# Patient Record
Sex: Female | Born: 1995 | Race: Black or African American | Hispanic: No | Marital: Single | Smoking: Never smoker
Health system: Southern US, Community
[De-identification: ages and names within clinical notes are randomized; demographics above are authoritative.]

## PROBLEM LIST (undated history)

## (undated) DIAGNOSIS — F419 Anxiety disorder, unspecified: Secondary | ICD-10-CM

## (undated) DIAGNOSIS — D649 Anemia, unspecified: Secondary | ICD-10-CM

## (undated) DIAGNOSIS — G43909 Migraine, unspecified, not intractable, without status migrainosus: Secondary | ICD-10-CM

## (undated) DIAGNOSIS — Z9889 Other specified postprocedural states: Secondary | ICD-10-CM

## (undated) HISTORY — PX: NO PAST SURGERIES: SHX2092

## (undated) HISTORY — PX: TONSILLECTOMY: SUR1361

## (undated) HISTORY — PX: DILATION AND CURETTAGE OF UTERUS: SHX78

---
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
## 2013-05-15 ENCOUNTER — Emergency Department: Payer: Self-pay | Admitting: Emergency Medicine

## 2013-05-15 LAB — URINALYSIS, COMPLETE
Ph: 6 (ref 4.5–8.0)
Protein: 100
RBC,UR: 112 /HPF (ref 0–5)

## 2013-05-15 LAB — PREGNANCY, URINE: Pregnancy Test, Urine: NEGATIVE m[IU]/mL

## 2013-06-30 ENCOUNTER — Emergency Department: Payer: Self-pay | Admitting: Internal Medicine

## 2013-08-28 ENCOUNTER — Emergency Department: Payer: Self-pay | Admitting: Emergency Medicine

## 2013-08-28 LAB — URINALYSIS, COMPLETE
Bacteria: NONE SEEN
Bilirubin,UR: NEGATIVE
Glucose,UR: NEGATIVE mg/dL (ref 0–75)
Ketone: NEGATIVE
Protein: NEGATIVE
RBC,UR: 1 /HPF (ref 0–5)
Specific Gravity: 1.025 (ref 1.003–1.030)
WBC UR: 7 /HPF (ref 0–5)

## 2013-08-28 LAB — CBC
HGB: 12.7 g/dL (ref 12.0–16.0)
MCV: 81 fL (ref 80–100)
WBC: 3.7 10*3/uL (ref 3.6–11.0)

## 2013-08-28 LAB — WET PREP, GENITAL

## 2013-08-29 LAB — URINE CULTURE

## 2014-04-25 DIAGNOSIS — Z833 Family history of diabetes mellitus: Secondary | ICD-10-CM | POA: Insufficient documentation

## 2019-02-09 DIAGNOSIS — D509 Iron deficiency anemia, unspecified: Secondary | ICD-10-CM | POA: Insufficient documentation

## 2019-02-21 ENCOUNTER — Other Ambulatory Visit: Payer: Self-pay

## 2019-02-21 ENCOUNTER — Ambulatory Visit
Admission: EM | Admit: 2019-02-21 | Discharge: 2019-02-21 | Disposition: A | Discharge status: Home / Self Care | Payer: Managed Care, Other (non HMO) | Attending: Emergency Medicine | Admitting: Emergency Medicine

## 2019-02-21 ENCOUNTER — Ambulatory Visit (INDEPENDENT_AMBULATORY_CARE_PROVIDER_SITE_OTHER): Payer: Managed Care, Other (non HMO)

## 2019-02-21 ENCOUNTER — Encounter: Payer: Self-pay | Admitting: Emergency Medicine

## 2019-02-21 DIAGNOSIS — R071 Chest pain on breathing: Secondary | ICD-10-CM

## 2019-02-21 DIAGNOSIS — J029 Acute pharyngitis, unspecified: Secondary | ICD-10-CM | POA: Diagnosis not present

## 2019-02-21 LAB — RAPID STREP SCREEN (MED CTR MEBANE ONLY): Streptococcus, Group A Screen (Direct): NEGATIVE

## 2019-02-21 MED ORDER — IBUPROFEN 600 MG PO TABS: 600.0000 mg | ORAL_TABLET | Freq: Four times a day (QID) | ORAL | 0 refills | Status: DC | PRN

## 2019-02-21 NOTE — ED Triage Notes (Signed)
Pt c/o sore throat, chest pain when she breathes and upper back pain. Started this morning. She is also having sweats and body aches.

## 2019-02-21 NOTE — ED Provider Notes (Signed)
HPI  SUBJECTIVE:  Dana Walker is a 23 y.o. female who presents with sore throat and diffuse constant anterior and posterior thoracic pain starting this morning.  States that her chest feels sore with breathing and becomes sharp with deep inspiration.  She reports a headache, questionable dyspnea on exertion.  No shortness of breath.  No fevers, body aches, coughing, wheezing, nasal congestion, rhinorrhea, postnasal drip, sinus pain or pressure, recent viral illness.  No palpitations, syncope nausea, diaphoresis with this chest pain.  It does not get worse with exertion.  No change in her physical activity.  No voice changes, sensation of throat swelling shut, difficulty breathing, drooling, trismus, abdominal pain, rash, allergy or GERD symptoms.  She got a flu shot this year.  No contacts with the flu.  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  No antipyretic in the past 4 to 6 hours.  She tried peppermint tea with improvement in her sore throat, her chest pain is worse with deep inspiration, torso rotation, arm movement, and sitting straight up.  Past medical history of anemia, GERD during pregnancy.  No history of asthma, emphysema, COPD, smoking, vaping, pneumothorax, diabetes, hypertension, PE, DVT, cancer.  P: 3/9.  Denies possibility being pregnant.  PMD: Dr. Serita Kyle at [facility] family practice in [place].    History reviewed. No pertinent past medical history.  Past Surgical History:  Procedure Laterality Date  . NO PAST SURGERIES      Family History  Problem Relation Age of Onset  . Diabetes Mother   . Healthy Father     Social History   Tobacco Use  . Smoking status: Never Smoker  . Smokeless tobacco: Never Used  Substance Use Topics  . Alcohol use: Not Currently  . Drug use: Not Currently    No current facility-administered medications for this encounter.   Current Outpatient Medications:   .  ibuprofen (ADVIL,MOTRIN) 600 MG tablet, Take 1 tablet (600 mg total) by mouth every 6 (six) hours as needed., Disp: 30 tablet, Rfl: 0  Allergies  Allergen Reactions  . Penicillins Diarrhea  . Latex Rash     ROS  As noted in HPI.   Physical Exam  BP 109/74 (BP Location: Left Arm)   Pulse 74   Temp 98.1 F (36.7 C) (Oral)   Resp 18   Ht 5\' 6"  (1.676 m)   Wt 58.1 kg   LMP 02/14/2019 (Approximate)   SpO2 100%   BMI 20.66 kg/m   Constitutional: Well developed, well nourished, no acute distress Eyes:  EOMI, conjunctiva normal bilaterally HENT: Normocephalic, atraumatic,mucus membranes moist.  No nasal congestion.  Normal turbinates.  Tonsils surgically absent.  Oropharynx normal.  No postnasal drip, cobblestoning. Neck: No appreciable anterior, posterior cervical lymphadenopathy Respiratory: Normal inspiratory effort, lungs clear bilaterally, good air movement.  Positive lateral chest wall tenderness.  Pain aggravated with arm movement, torso rotation. Cardiovascular: Normal rate regular rhythm, no murmurs rubs or gallops GI: nondistended, no splenomegaly. skin: No rash, skin intact Musculoskeletal: no deformities.  Calves symmetric, nontender, no edema. Neurologic: Alert & oriented x 3, no focal neuro deficits Psychiatric: Speech and behavior appropriate   ED Course   Medications - No data to display  Orders Placed This Encounter  Procedures  . Rapid Strep Screen ([facility] ONLY)    Standing Status:   Standing    Number of Occurrences:   1  .  Culture, group A strep    Standing Status:   Standing    Number of Occurrences:   1  . DG Chest 2 View    Standing Status:   Standing    Number of Occurrences:   1    Order Specific Question:   Reason for Exam (SYMPTOM  OR DIAGNOSIS REQUIRED)    Answer:   Chest pain  . Droplet precaution    Standing Status:   Standing    Number of Occurrences:   1    Results for orders placed or performed during the hospital  encounter of 02/21/19 (from the past 24 hour(s))  Rapid Strep Screen ([facility] ONLY)     Status: None   Collection Time: 02/21/19  7:17 PM  Result Value Ref Range   Streptococcus, Group A Screen (Direct) NEGATIVE NEGATIVE   Dg Chest 2 View  Result Date: 02/21/2019 CLINICAL DATA:  Chest pain EXAM: CHEST - 2 VIEW COMPARISON:  None. FINDINGS: Heart and mediastinal contours are within normal limits. No focal opacities or effusions. No acute bony abnormality. IMPRESSION: No active cardiopulmonary disease. Electronically Signed   By: Charlett Nose M.D.   On: 02/21/2019 20:01    ED Clinical Impression  Sore throat  Chest pain on breathing   ED Assessment/Plan  Checking chest x-ray to rule out pneumothorax, dissection, pulmonary edema, pneumonia.  Her presentation seems very consistent with musculoskeletal chest pain.  GERD in the ddx with the sore throat.  Strep negative.  She refused a flu test but I do not think that this is influenza.  Chest pain is reproducible with movement and palpation.   PERC negative. Doubt PE.  Feel that she is very low risk for ACS.  Doubt pericarditis, myocarditis.  Reviewed imaging independently.  Normal chest x-ray.  See radiology report for full details.  Chest x-ray normal.  Presentation consistent with a musculoskeletal chest pain versus pleurisy.  We will send home with supportive treatment including ibuprofen 600 mg combined with 1 g of Tylenol 3-4 times a day as needed.  Benadryl/Maalox mixture and throat coat tea for the sore throat.  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  Discussed imaging, MDM, treatment plan, and plan for follow-up with patient. Discussed sn/sx that should prompt return to the ED. patient agrees with plan.   Meds ordered this encounter  Medications  . ibuprofen (ADVIL,MOTRIN) 600 MG tablet    Sig: Take 1 tablet (600 mg total) by mouth every 6 (six) hours as needed.    Dispense:  30 tablet    Refill:  0     *This clinic note was created using Scientist, clinical (histocompatibility and immunogenetics). Therefore, there may be occasional mistakes despite careful proofreading.   ?   Domenick Gong, MD 02/21/19 2035

## 2019-02-21 NOTE — Discharge Instructions (Signed)
your rapid strep was negative today, so we have sent off a throat culture.  We will contact you and call in the appropriate antibiotics if your culture comes back positive for an infection requiring antibiotic treatment.  Give us a working phone number. 1 gram of Tylenol and 600 mg ibuprofen together 3-4 times a day as needed for pain.  Make sure you drink plenty of extra fluids.  Some people find salt water gargles and  Traditional Medic[employer]'s "Throat Coat" tea helpful. Take 5 mL of liquid Benadryl and 5 mL of Maalox. Mix it together, and then hold it in your mouth for as long as you can and then swallow. You may do this 4 times a day.   ° °Go to www.goodrx.com to look up your medications. This will give you a list of where you can find your prescriptions at the most affordable prices. Or ask the pharmacist what the cash price is, or if they have any other discount programs available to help make your medication more affordable. This can be less expensive than what you would pay with insurance.   °

## 2019-02-24 LAB — CULTURE, GROUP A STREP (THRC)

## 2019-09-30 ENCOUNTER — Ambulatory Visit
Admission: EM | Admit: 2019-09-30 | Discharge: 2019-09-30 | Disposition: A | Discharge status: Home / Self Care | Payer: Managed Care, Other (non HMO) | Attending: Family Medicine | Admitting: Family Medicine

## 2019-09-30 ENCOUNTER — Ambulatory Visit (INDEPENDENT_AMBULATORY_CARE_PROVIDER_SITE_OTHER): Payer: Managed Care, Other (non HMO)

## 2019-09-30 ENCOUNTER — Other Ambulatory Visit: Payer: Self-pay

## 2019-09-30 ENCOUNTER — Encounter: Payer: Self-pay | Admitting: Emergency Medicine

## 2019-09-30 DIAGNOSIS — M25571 Pain in right ankle and joints of right foot: Secondary | ICD-10-CM

## 2019-09-30 MED ORDER — HYDROCODONE-ACETAMINOPHEN 5-325 MG PO TABS: ORAL_TABLET | ORAL | 0 refills | Status: DC

## 2019-09-30 MED ORDER — PREDNISONE 20 MG PO TABS: ORAL_TABLET | ORAL | 0 refills | Status: DC

## 2019-09-30 NOTE — ED Provider Notes (Signed)
[facility]    CSN: [id number] Arrival date & time: 09/30/19  1749      History   Chief Complaint Chief Complaint  Patient presents with  . Ankle Pain    HPI Dana Walker is a 23 y.o. female.   23 yo female with a c/o right ankle pain for the past 3 weeks but significant increase in pain for the past 5 days. Denies any injury, fall, fevers, chills, rash.    Ankle Pain   History reviewed. No pertinent past medical history.  There are no active problems to display for this patient.   Past Surgical History:  Procedure Laterality Date  . NO PAST SURGERIES      OB History   No obstetric history on file.      Home Medications    Prior to Admission medications   Medication Sig Start Date End Date Taking? Authorizing Provider  HYDROcodone-acetaminophen (NORCO/VICODIN) 5-325 MG tablet 1-2 tabs po bid prn 09/30/19   Norval Gable, MD  ibuprofen (ADVIL,MOTRIN) 600 MG tablet Take 1 tablet (600 mg total) by mouth every 6 (six) hours as needed. 02/21/19   Melynda Ripple, MD  predniSONE (DELTASONE) 20 MG tablet 3 tabs po qd x 2 days, then 2 tabs po qd x 2 days, then 1 tab po qd x 2 days, then half a tab po qd x 2 days 09/30/19   Norval Gable, MD    Family History Family History  Problem Relation Age of Onset  . Diabetes Mother   . Healthy Father     Social History Social History   Tobacco Use  . Smoking status: Never Smoker  . Smokeless tobacco: Never Used  Substance Use Topics  . Alcohol use: Not Currently  . Drug use: Not Currently     Allergies   Penicillins and Latex   Review of Systems Review of Systems   Physical Exam Triage Vital Signs ED Triage Vitals [09/30/19 1818]  Enc Vitals Group     BP 112/76     Pulse Rate 74     Resp 14     Temp 98.3 F (36.8 C)     Temp Source Oral     SpO2 100 %     Weight 136 lb (61.7 kg)     Height 5\' 6"  (1.676 m)     Head Circumference      Peak Flow      Pain Score 7     Pain Loc       Pain Edu?      Excl. in [place]?    No data found.  Updated Vital Signs BP 112/76 (BP Location: Left Arm)   Pulse 74   Temp 98.3 F (36.8 C) (Oral)   Resp 14   Ht 5\' 6"  (1.676 m)   Wt 61.7 kg   LMP 09/08/2019 (Approximate)   SpO2 100%   BMI 21.95 kg/m   Visual Acuity Right Eye Distance:   Left Eye Distance:   Bilateral Distance:    Right Eye Near:   Left Eye Near:    Bilateral Near:     Physical Exam Vitals signs and nursing note reviewed.  Constitutional:      General: She is not in acute distress.    Appearance: She is not toxic-appearing or diaphoretic.  Musculoskeletal:     Right ankle: She exhibits swelling. She exhibits normal range of motion, no ecchymosis, no deformity, no laceration and normal pulse. Tenderness (diffuse). Lateral malleolus  and medial malleolus tenderness found. No AITFL, no CF ligament, no posterior TFL, no head of 5th metatarsal and no proximal fibula tenderness found. Achilles tendon normal.  Neurological:     Mental Status: She is alert.      UC Treatments / Results  Labs (all labs ordered are listed, but only abnormal results are displayed) Labs Reviewed - No data to display  EKG   Radiology Dg Ankle Complete Right  Result Date: 09/30/2019 CLINICAL DATA:  Pain and swelling for 3 weeks EXAM: RIGHT ANKLE - COMPLETE 3+ VIEW COMPARISON:  None. FINDINGS: Frontal, oblique, and lateral views were obtained. No evident fracture or joint effusion. Joint spaces appear normal. No erosive change. Ankle mortise appears intact. IMPRESSION: No evident fracture or arthropathy.  Ankle mortise appears intact. Electronically Signed   By: Bretta Bang III M.D.   On: 09/30/2019 18:41    Procedures Procedures (including critical care time)  Medications Ordered in UC Medications - No data to display  Initial Impression / Assessment and Plan / UC Course  I have reviewed the triage vital signs and the nursing notes.  Pertinent labs & imaging  results that were available during my care of the patient were reviewed by me and considered in my medical decision making (see chart for details).     Final Clinical Impressions(s) / UC Diagnoses   Final diagnoses:  Acute right ankle pain  (likely gout)   Discharge Instructions     Rest, ice, elevation    ED Prescriptions    Medication Sig Dispense Auth. Provider   predniSONE (DELTASONE) 20 MG tablet 3 tabs po qd x 2 days, then 2 tabs po qd x 2 days, then 1 tab po qd x 2 days, then half a tab po qd x 2 days 13 tablet Matraca Hunkins, Pamala Hurry, MD   HYDROcodone-acetaminophen (NORCO/VICODIN) 5-325 MG tablet 1-2 tabs po bid prn 6 tablet Payton Mccallum, MD      1. x-ray result (negative) and diagnosis reviewed with patient 2. rx as per orders above; reviewed possible side effects, interactions, risks and benefits  3. Recommend supportive treatment as above 4. Follow-up prn if symptoms worsen or don't improve   I have reviewed the PDMP during this encounter.   Payton Mccallum, MD 09/30/19 2022

## 2019-09-30 NOTE — Discharge Instructions (Signed)
Rest, ice, elevation °

## 2019-09-30 NOTE — ED Triage Notes (Signed)
Patient c/o right ankle pain for the past 3 weeks.  Patient denies any specific injury.

## 2019-11-14 ENCOUNTER — Ambulatory Visit
Admission: EM | Admit: 2019-11-14 | Discharge: 2019-11-14 | Disposition: A | Discharge status: Home / Self Care | Payer: Managed Care, Other (non HMO) | Attending: Family Medicine | Admitting: Family Medicine

## 2019-11-14 ENCOUNTER — Other Ambulatory Visit: Payer: Self-pay

## 2019-11-14 DIAGNOSIS — B349 Viral infection, unspecified: Secondary | ICD-10-CM | POA: Diagnosis not present

## 2019-11-14 DIAGNOSIS — R110 Nausea: Secondary | ICD-10-CM

## 2019-11-14 MED ORDER — ONDANSETRON 8 MG PO TBDP: 8.0000 mg | ORAL_TABLET | Freq: Three times a day (TID) | ORAL | 0 refills | Status: DC | PRN

## 2019-11-14 NOTE — ED Provider Notes (Signed)
[facility]    CSN: [id number] Arrival date & time: 11/14/19  1705      History   Chief Complaint Chief Complaint  Patient presents with  . Diarrhea  . Nausea    HPI Dana Walker is a 23 y.o. female.   23 yo female with a c/o nausea, diarrhea, shortness of breath since yesterday and loss of taste today. Patient denies any fevers, chills. States mom just tested positive for covid.     Diarrhea   History reviewed. No pertinent past medical history.  There are no active problems to display for this patient.   Past Surgical History:  Procedure Laterality Date  . NO PAST SURGERIES      OB History   No obstetric history on file.      Home Medications    Prior to Admission medications   Medication Sig Start Date End Date Taking? Authorizing Provider  HYDROcodone-acetaminophen (NORCO/VICODIN) 5-325 MG tablet 1-2 tabs po bid prn 09/30/19   Norval Gable, MD  ibuprofen (ADVIL,MOTRIN) 600 MG tablet Take 1 tablet (600 mg total) by mouth every 6 (six) hours as needed. 02/21/19   Melynda Ripple, MD  ondansetron (ZOFRAN ODT) 8 MG disintegrating tablet Take 1 tablet (8 mg total) by mouth every 8 (eight) hours as needed. 11/14/19   Norval Gable, MD  predniSONE (DELTASONE) 20 MG tablet 3 tabs po qd x 2 days, then 2 tabs po qd x 2 days, then 1 tab po qd x 2 days, then half a tab po qd x 2 days 09/30/19   Norval Gable, MD    Family History Family History  Problem Relation Age of Onset  . Diabetes Mother   . Healthy Father     Social History Social History   Tobacco Use  . Smoking status: Never Smoker  . Smokeless tobacco: Never Used  Substance Use Topics  . Alcohol use: Not Currently  . Drug use: Not Currently     Allergies   Ceclor [cefaclor], Penicillins, and Latex   Review of Systems Review of Systems  Gastrointestinal: Positive for diarrhea.     Physical Exam Triage Vital Signs ED Triage Vitals  Enc Vitals Group     BP 11/14/19  1724 114/70     Pulse Rate 11/14/19 1724 83     Resp 11/14/19 1724 18     Temp 11/14/19 1724 98.8 F (37.1 C)     Temp Source 11/14/19 1724 Oral     SpO2 11/14/19 1724 100 %     Weight 11/14/19 1725 135 lb 12.9 oz (61.6 kg)     Height 11/14/19 1725 5\' 1"  (1.549 m)     Head Circumference --      Peak Flow --      Pain Score 11/14/19 1725 0     Pain Loc --      Pain Edu? --      Excl. in [place]? --    No data found.  Updated Vital Signs BP 114/70 (BP Location: Right Arm)   Pulse 83   Temp 98.8 F (37.1 C) (Oral)   Resp 18   Ht 5\' 1"  (1.549 m)   Wt 61.6 kg   LMP 10/08/2019   SpO2 100%   BMI 25.66 kg/m   Visual Acuity Right Eye Distance:   Left Eye Distance:   Bilateral Distance:    Right Eye Near:   Left Eye Near:    Bilateral Near:     Physical Exam  Vitals signs and nursing note reviewed.  Constitutional:      General: She is not in acute distress.    Appearance: She is not toxic-appearing or diaphoretic.  Cardiovascular:     Rate and Rhythm: Normal rate.  Pulmonary:     Effort: Pulmonary effort is normal. No respiratory distress.     Breath sounds: Normal breath sounds.  Abdominal:     General: Bowel sounds are normal. There is no distension.     Palpations: Abdomen is soft. There is no mass.     Tenderness: There is no abdominal tenderness. There is no right CVA tenderness, left CVA tenderness, guarding or rebound.     Hernia: No hernia is present.  Neurological:     Mental Status: She is alert.      UC Treatments / Results  Labs (all labs ordered are listed, but only abnormal results are displayed) Labs Reviewed  NOVEL CORONAVIRUS, NAA (HOSP ORDER, SEND-OUT TO REF LAB; TAT 18-24 HRS)    EKG   Radiology No results found.  Procedures Procedures (including critical care time)  Medications Ordered in UC Medications - No data to display  Initial Impression / Assessment and Plan / UC Course  I have reviewed the triage vital signs and the nursing  notes.  Pertinent labs & imaging results that were available during my care of the patient were reviewed by me and considered in my medical decision making (see chart for details).      Final Clinical Impressions(s) / UC Diagnoses   Final diagnoses:  Viral syndrome  Nausea     Discharge Instructions     Over the counter Imodium AD, tylenol as needed Increase fluids     ED Prescriptions    Medication Sig Dispense Auth. Provider   ondansetron (ZOFRAN ODT) 8 MG disintegrating tablet Take 1 tablet (8 mg total) by mouth every 8 (eight) hours as needed. 6 tablet Payton Mccallum, MD     1. diagnosis reviewed with patient 2. rx as per orders above; reviewed possible side effects, interactions, risks and benefits  3. Recommend supportive treatment as above 4. covid test done  5. Follow-up prn if symptoms worsen or don't improve   PDMP not reviewed this encounter.   Payton Mccallum, MD 11/14/19 1754

## 2019-11-14 NOTE — Discharge Instructions (Signed)
Over the counter Imodium AD, tylenol as needed Increase fluids

## 2019-11-14 NOTE — ED Triage Notes (Addendum)
Pt states she started yesterday with feeling of SOB, nausea, diarrhea, and an episode of vomiting today. Fever better today. Loss of taste today. Mom just tested positive for COVID

## 2019-11-15 LAB — NOVEL CORONAVIRUS, NAA (HOSP ORDER, SEND-OUT TO REF LAB; TAT 18-24 HRS): SARS-CoV-2, NAA: NOT DETECTED

## 2019-11-27 ENCOUNTER — Other Ambulatory Visit: Payer: Self-pay

## 2019-11-27 ENCOUNTER — Ambulatory Visit
Admission: EM | Admit: 2019-11-27 | Discharge: 2019-11-27 | Disposition: A | Discharge status: Home / Self Care | Payer: Managed Care, Other (non HMO) | Attending: Family Medicine | Admitting: Family Medicine

## 2019-11-27 DIAGNOSIS — J029 Acute pharyngitis, unspecified: Secondary | ICD-10-CM | POA: Diagnosis not present

## 2019-11-27 DIAGNOSIS — R05 Cough: Secondary | ICD-10-CM

## 2019-11-27 DIAGNOSIS — R509 Fever, unspecified: Secondary | ICD-10-CM | POA: Diagnosis not present

## 2019-11-27 DIAGNOSIS — J069 Acute upper respiratory infection, unspecified: Secondary | ICD-10-CM

## 2019-11-27 LAB — RAPID STREP SCREEN (MED CTR MEBANE ONLY): Streptococcus, Group A Screen (Direct): NEGATIVE

## 2019-11-27 NOTE — ED Provider Notes (Signed)
[facility]    CSN: [id number] Arrival date & time: 11/27/19  1027      History   Chief Complaint Chief Complaint  Patient presents with  . Sore Throat    HPI Dana Walker is a 23 y.o. female.   23 yo female with a c/o fever, cough, headaches, nausea for the past 6 days. Denies any chest pain, shortness of breath. States positive covid positive exposure.    Sore Throat    No past medical history on file.  There are no problems to display for this patient.   Past Surgical History:  Procedure Laterality Date  . NO PAST SURGERIES      OB History   No obstetric history on file.      Home Medications    Prior to Admission medications   Medication Sig Start Date End Date Taking? Authorizing Provider  HYDROcodone-acetaminophen (NORCO/VICODIN) 5-325 MG tablet 1-2 tabs po bid prn 09/30/19   Norval Gable, MD  ibuprofen (ADVIL,MOTRIN) 600 MG tablet Take 1 tablet (600 mg total) by mouth every 6 (six) hours as needed. 02/21/19   Melynda Ripple, MD  ondansetron (ZOFRAN ODT) 8 MG disintegrating tablet Take 1 tablet (8 mg total) by mouth every 8 (eight) hours as needed. 11/14/19   Norval Gable, MD  predniSONE (DELTASONE) 20 MG tablet 3 tabs po qd x 2 days, then 2 tabs po qd x 2 days, then 1 tab po qd x 2 days, then half a tab po qd x 2 days 09/30/19   Norval Gable, MD    Family History Family History  Problem Relation Age of Onset  . Diabetes Mother   . Healthy Father     Social History Social History   Tobacco Use  . Smoking status: Never Smoker  . Smokeless tobacco: Never Used  Substance Use Topics  . Alcohol use: Not Currently  . Drug use: Not Currently     Allergies   Ceclor [cefaclor], Penicillins, and Latex   Review of Systems Review of Systems   Physical Exam Triage Vital Signs ED Triage Vitals  Enc Vitals Group     BP 11/27/19 1040 107/68     Pulse Rate 11/27/19 1040 87     Resp 11/27/19 1040 16     Temp 11/27/19  1040 98.3 F (36.8 C)     Temp Source 11/27/19 1040 Oral     SpO2 11/27/19 1040 100 %     Weight 11/27/19 1038 136 lb (61.7 kg)     Height 11/27/19 1038 5\' 6"  (1.676 m)     Head Circumference --      Peak Flow --      Pain Score 11/27/19 1038 6     Pain Loc --      Pain Edu? --      Excl. in [employer]? --    No data found.  Updated Vital Signs BP 107/68 (BP Location: Left Arm)   Pulse 87   Temp 98.3 F (36.8 C) (Oral)   Resp 16   Ht 5\' 6"  (1.676 m)   Wt 61.7 kg   SpO2 100%   BMI 21.95 kg/m   Visual Acuity Right Eye Distance:   Left Eye Distance:   Bilateral Distance:    Right Eye Near:   Left Eye Near:    Bilateral Near:     Physical Exam Vitals and nursing note reviewed.  Constitutional:      General: She is not in acute distress.  Appearance: She is not toxic-appearing or diaphoretic.  Cardiovascular:     Rate and Rhythm: Normal rate.  Pulmonary:     Effort: Pulmonary effort is normal. No respiratory distress.  Neurological:     Mental Status: She is alert.      UC Treatments / Results  Labs (all labs ordered are listed, but only abnormal results are displayed) Labs Reviewed  RAPID STREP SCREEN ([facility] ONLY)  CULTURE, GROUP A STREP ([facility])  NOVEL CORONAVIRUS, NAA (HOSP ORDER, SEND-OUT TO REF LAB; TAT 18-24 HRS)    EKG   Radiology No results found.  Procedures Procedures (including critical care time)  Medications Ordered in UC Medications - No data to display  Initial Impression / Assessment and Plan / UC Course  I have reviewed the triage vital signs and the nursing notes.  Pertinent labs & imaging results that were available during my care of the patient were reviewed by me and considered in my medical decision making (see chart for details).      Final Clinical Impressions(s) / UC Diagnoses   Final diagnoses:  Viral URI with cough     Discharge Instructions     Rest, fluids, over the counter medications as  needed    ED Prescriptions    None      1. Lab result and diagnosis reviewed with patient 2. covid test done  3. Recommend supportive treatment as above 4. Follow-up prn if symptoms worsen or don't improve  PDMP not reviewed this encounter.   Payton Mccallum, MD 11/27/19 1126

## 2019-11-27 NOTE — ED Triage Notes (Signed)
Onset 6 days fever, HA, nausea was tested for Covid negative in early December.

## 2019-11-27 NOTE — Discharge Instructions (Signed)
Rest, fluids, over the counter medications as needed  

## 2019-11-28 LAB — NOVEL CORONAVIRUS, NAA (HOSP ORDER, SEND-OUT TO REF LAB; TAT 18-24 HRS): SARS-CoV-2, NAA: NOT DETECTED

## 2019-11-30 LAB — CULTURE, GROUP A STREP (THRC)

## 2021-01-11 IMAGING — CR DG ANKLE COMPLETE 3+V*R*
3 series · 3 of 3 positions shown · non-contrast
Comparison: None.

CLINICAL DATA: Pain and swelling for 3 weeks

EXAM:
RIGHT ANKLE - COMPLETE 3+ VIEW

[ankle ap]
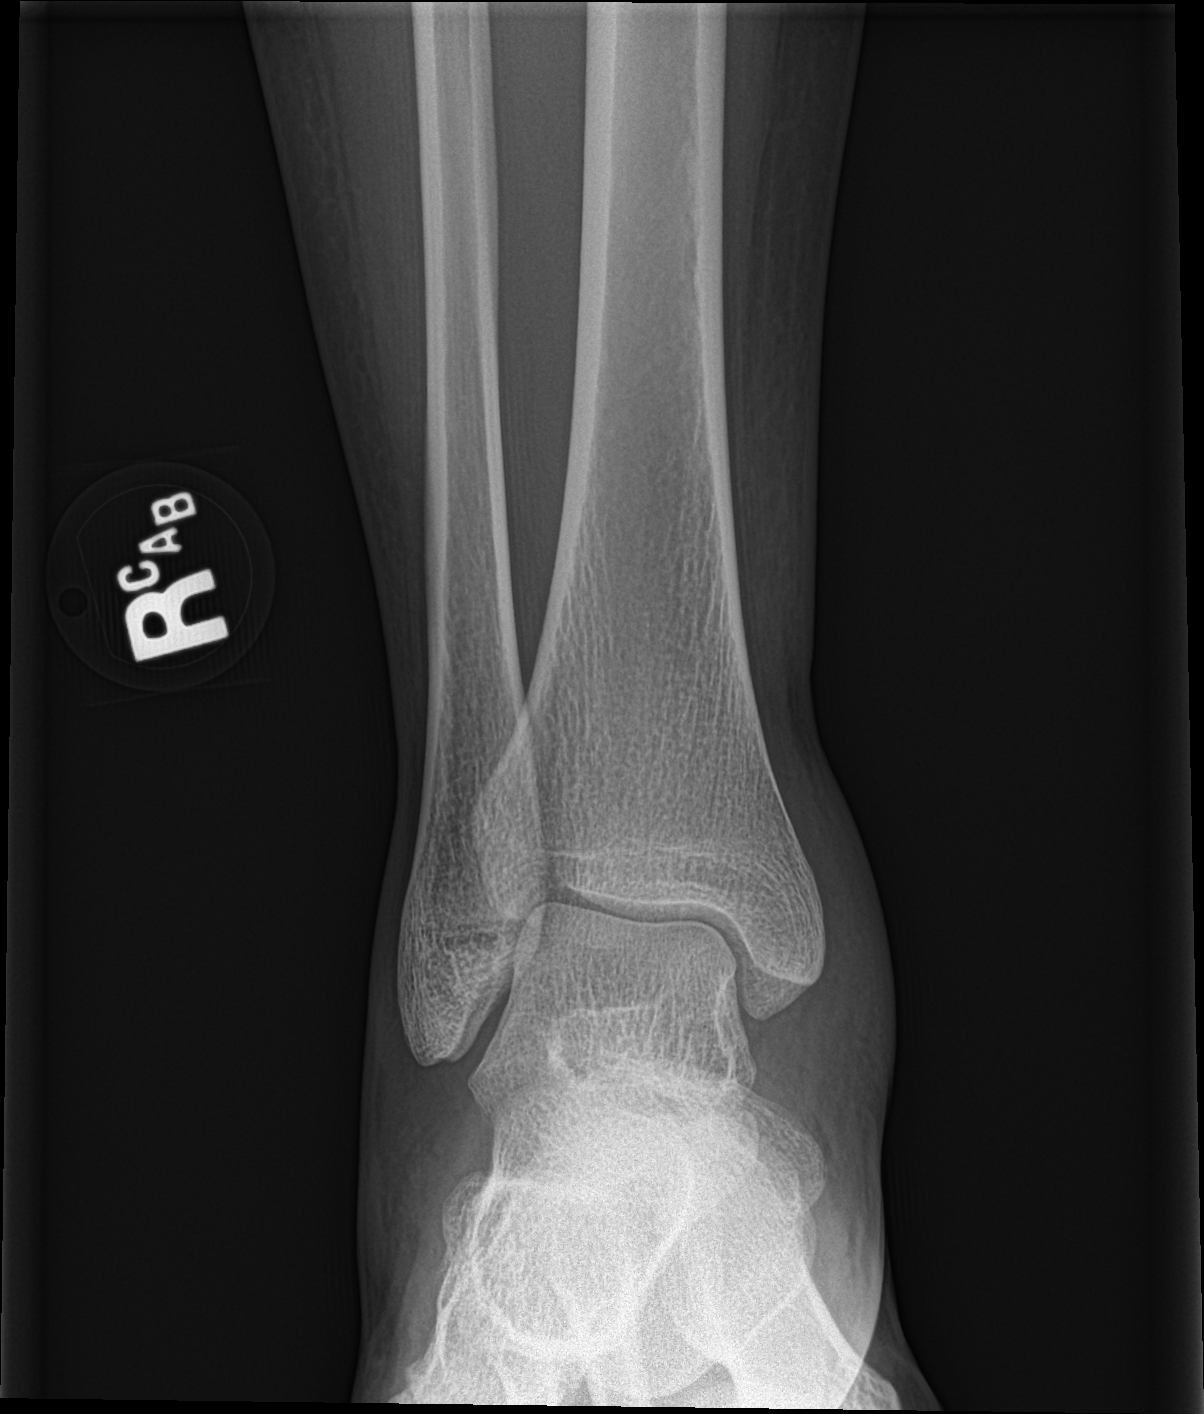

[ankle obl]
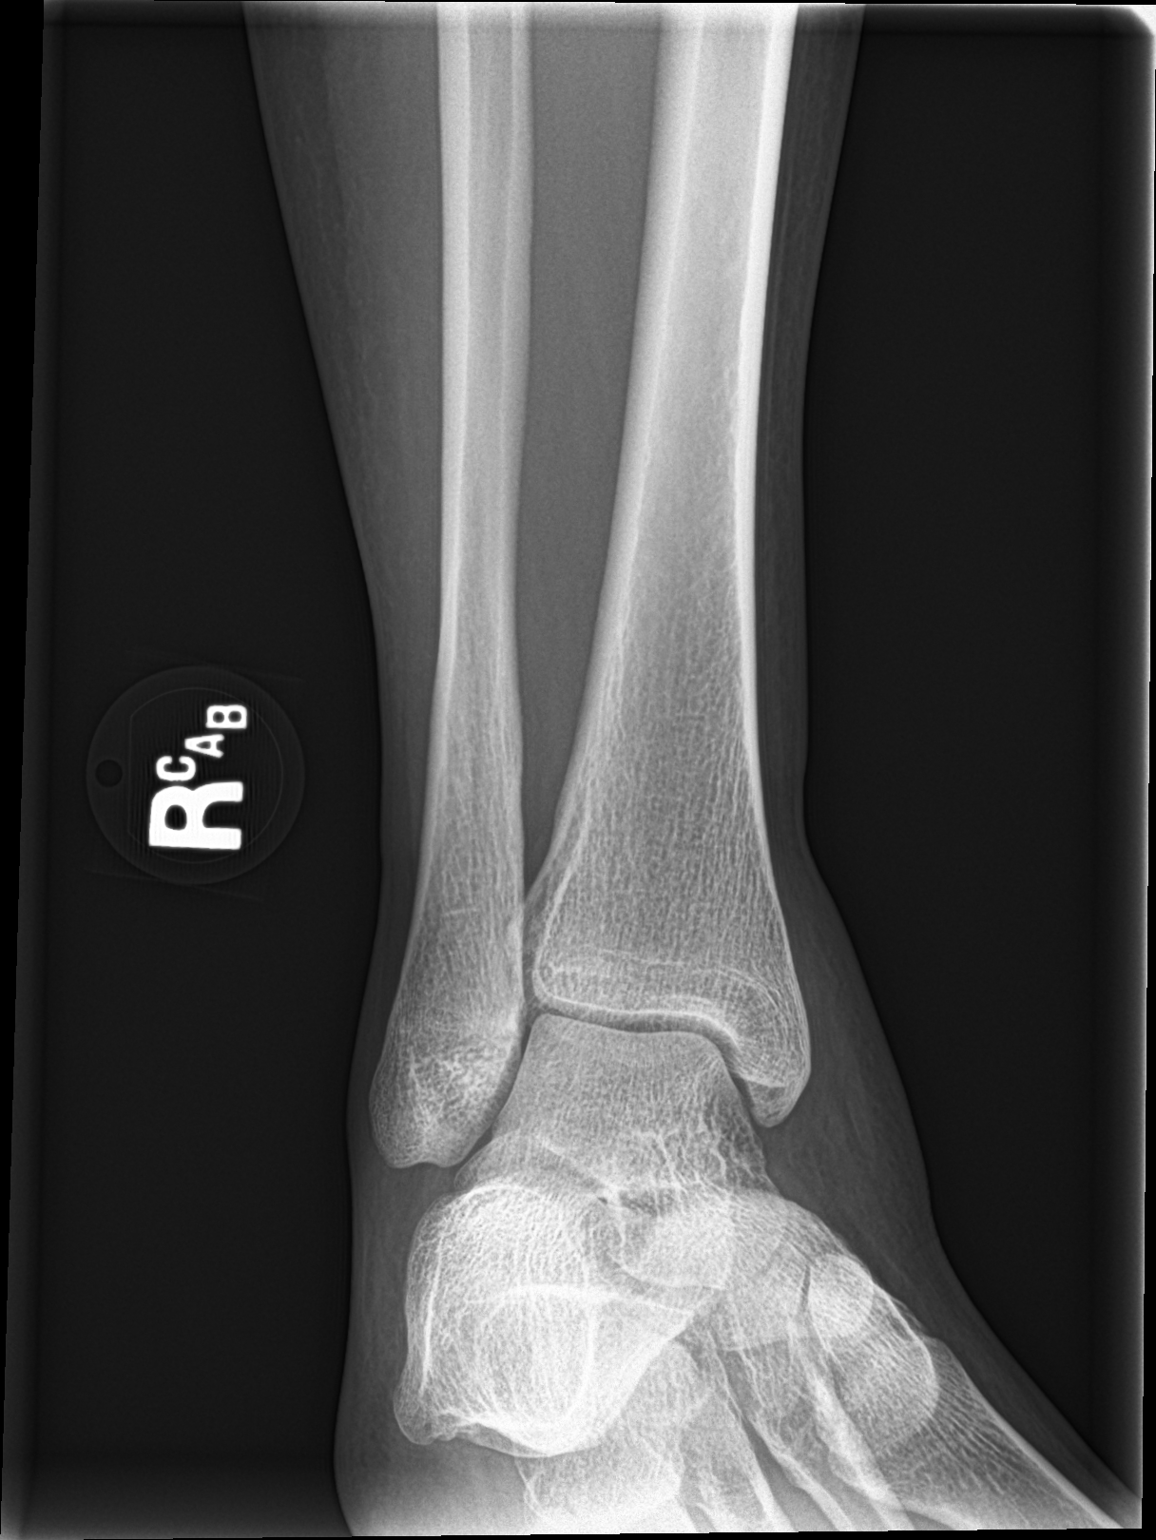

[ankle lat]
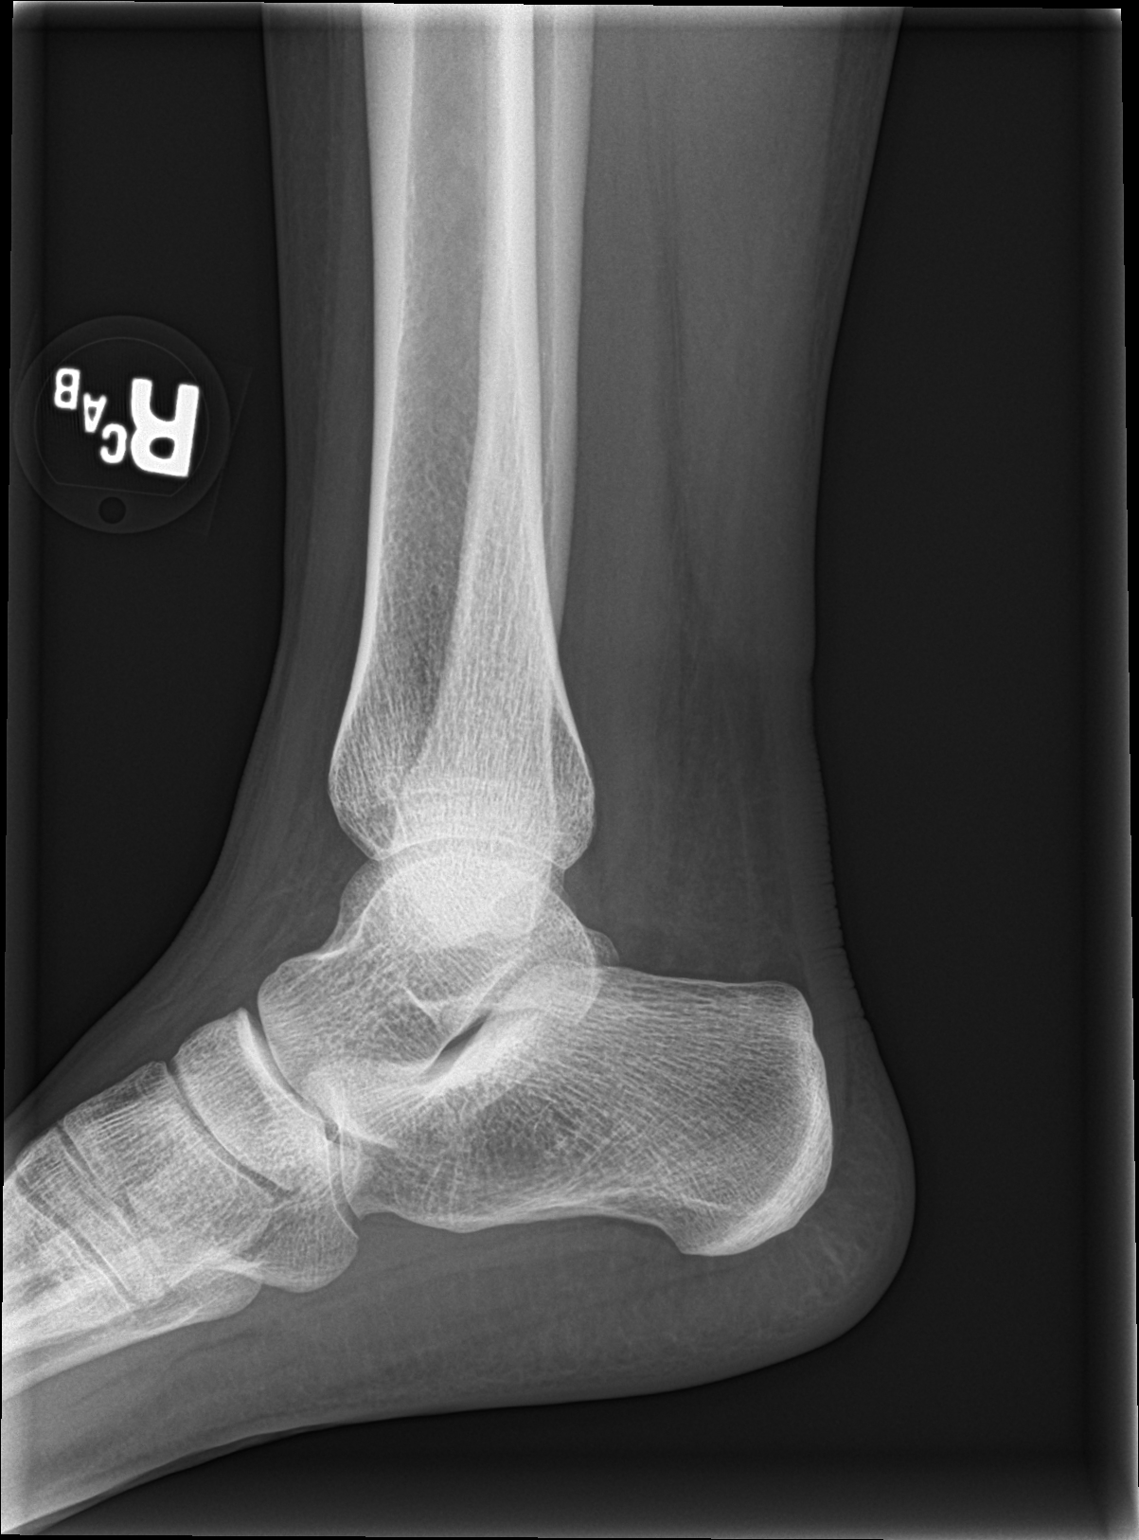

[3 of 3 positions shown; findings below may reference images not displayed]

FINDINGS: Frontal, oblique, and lateral views were obtained. No evident
fracture or joint effusion. Joint spaces appear normal. No erosive
change. Ankle mortise appears intact.
IMPRESSION: No evident fracture or arthropathy.  Ankle mortise appears intact.

## 2021-01-19 ENCOUNTER — Ambulatory Visit
Admission: EM | Admit: 2021-01-19 | Discharge: 2021-01-19 | Disposition: A | Discharge status: Home / Self Care | Payer: 59 | Attending: Emergency Medicine | Admitting: Emergency Medicine

## 2021-01-19 ENCOUNTER — Other Ambulatory Visit: Payer: Self-pay

## 2021-01-19 DIAGNOSIS — J029 Acute pharyngitis, unspecified: Secondary | ICD-10-CM | POA: Diagnosis not present

## 2021-01-19 MED ORDER — LEVOCETIRIZINE DIHYDROCHLORIDE 5 MG PO TABS: 5.0000 mg | ORAL_TABLET | Freq: Every evening | ORAL | 1 refills | Status: DC

## 2021-01-19 MED ORDER — FLUTICASONE PROPIONATE 50 MCG/ACT NA SUSP: 2.0000 | Freq: Every day | NASAL | 2 refills | Status: DC

## 2021-01-19 NOTE — ED Provider Notes (Signed)
[facility] [place] [facility] [place]   Name: Dana Walker DOB: [birth year] MRN: [id number] CSN: [id number] PCP: Cathie Hoops, PA  Arrival date and time:  01/19/21 0847  Chief Complaint:  Sore Throat and Cough   NOTE: Prior to seeing the patient today, I have reviewed the triage nursing documentation and vital signs. Clinical staff has updated patient's PMH/PSHx, current medication list, and drug allergies/intolerances to ensure comprehensive history available to assist in medical decision making.   History:   HPI: Dana Walker is a 25 y.o. female who presents today with complaints of sore throat and migraine.  Surface at approximately Wednesday and she has also noticed some increased drainage to the back of her throat.  The drainage gets to the point that she feels as if she is choking or gagging.  She has tried warm salt water gargles and has felt minimal to no relief.  She has not tried any over-the-counter medications such as antihistamines, or Tylenol/ibuprofen for her headache.  She denies shortness of breath, coughing, fevers, body aches.  She is unvaccinated for Covid and states she is not interested in getting a vaccine at this time.  She works from home.   History reviewed. No pertinent past medical history.  Past Surgical History:  Procedure Laterality Date  . TONSILLECTOMY      Family History  Problem Relation Age of Onset  . Diabetes Mother   . Healthy Father     Social History   Tobacco Use  . Smoking status: Never Smoker  . Smokeless tobacco: Never Used  Vaping Use  . Vaping Use: Never used  Substance Use Topics  . Alcohol use: Not Currently  . Drug use: Not Currently    There are no problems to display for this patient.   Home Medications:    Current Meds  Medication Sig  . fluticasone (FLONASE) 50 MCG/ACT nasal spray Place 2 sprays into both nostrils daily.  Marland Kitchen levocetirizine (XYZAL) 5 MG tablet Take 1 tablet (5 mg total) by mouth  every evening.    Allergies:   Ceclor [cefaclor], Penicillins, and Latex  Review of Systems (ROS): Review of Systems  Constitutional: Negative for activity change, appetite change, chills, fatigue and fever.  HENT: Positive for postnasal drip and sore throat. Negative for congestion, sneezing and tinnitus.   Respiratory: Negative for cough and wheezing.   Gastrointestinal: Negative for nausea.  Musculoskeletal: Negative for myalgias.  Neurological: Positive for headaches. Negative for dizziness and weakness.     Vital Signs: Today's Vitals   01/19/21 0903 01/19/21 0904 01/19/21 0906 01/19/21 0940  BP:   107/77   Pulse:   82   Resp:   18   Temp:   98.4 F (36.9 C)   TempSrc:   Oral   SpO2:   99%   Weight:  138 lb (62.6 kg)    Height:  5\' 6"  (1.676 m)    PainSc: 7    7     Physical Exam: Physical Exam Vitals and nursing note reviewed.  Constitutional:      General: She is not in acute distress.    Appearance: She is well-developed.  HENT:     Head: Normocephalic and atraumatic.     Right Ear: A middle ear effusion is present.     Left Ear: A middle ear effusion is present.     Nose:     Right Turbinates: Enlarged.     Left Turbinates: Enlarged.  Mouth/Throat:     Pharynx: Posterior oropharyngeal erythema present.     Tonsils: No tonsillar exudate or tonsillar abscesses.  Eyes:     Conjunctiva/sclera: Conjunctivae normal.  Cardiovascular:     Rate and Rhythm: Normal rate and regular rhythm.     Heart sounds: No murmur heard.   Pulmonary:     Effort: Pulmonary effort is normal. No respiratory distress.     Breath sounds: Normal breath sounds.  Abdominal:     Palpations: Abdomen is soft.     Tenderness: There is no abdominal tenderness.  Musculoskeletal:     Cervical back: Neck supple.  Lymphadenopathy:     Cervical: No cervical adenopathy.  Skin:    General: Skin is warm and dry.  Neurological:     Mental Status: She is alert.      Urgent Care  Treatments / Results:   LABS: PLEASE NOTE: all labs that were ordered this encounter are listed, however only abnormal results are displayed. Labs Reviewed - No data to display  EKG: -None  RADIOLOGY: No results found.  PROCEDURES: Procedures  MEDICATIONS RECEIVED THIS VISIT: Medications - No data to display  PERTINENT CLINICAL COURSE NOTES/UPDATES:   Initial Impression / Assessment and Plan / Urgent Care Course:  Pertinent labs & imaging results that were available during my care of the patient were personally reviewed by me and considered in my medical decision making (see lab/imaging section of note for values and interpretations).  Dana Walker is a 25 y.o. female who presents to [facility] today with complaints of sore throat, diagnosed with allergic pharyngitis, and treated as such with the medications below. NP and patient reviewed discharge instructions below during visit.   Patient is well appearing overall in clinic today. She does not appear to be in any acute distress. Presenting symptoms (see HPI) and exam as documented above.   I have reviewed the follow up and strict return precautions for any new or worsening symptoms. Patient is aware of symptoms that would be deemed urgent/emergent, and would thus require further evaluation either here or in the emergency department. At the time of discharge, she verbalized understanding and consent with the discharge plan as it was reviewed with her. All questions were fielded by provider and/or clinic staff prior to patient discharge.    Final Clinical Impressions / Urgent Care Diagnoses:   Final diagnoses:  Allergic pharyngitis    New Prescriptions:  [place] Controlled Substance Registry consulted? Not Applicable  Meds ordered this encounter  Medications  . levocetirizine (XYZAL) 5 MG tablet    Sig: Take 1 tablet (5 mg total) by mouth every evening.    Dispense:  30 tablet    Refill:  1  . fluticasone (FLONASE)  50 MCG/ACT nasal spray    Sig: Place 2 sprays into both nostrils daily.    Dispense:  18.2 mL    Refill:  2      Discharge Instructions     You were seen for sore throat and headache and are being treated for allergies.   Take medication as prescribed.  Over-the-counter ibuprofen can also help with  your migraine as needed.  Take care, Dr. Sharlet Salina, NP-c     Recommended Follow up Care:  Patient encouraged to follow up with the following provider within the specified time frame, or sooner as dictated by the severity of her symptoms. As always, she was instructed that for any urgent/emergent care needs, she should seek care either here  or in the emergency department for more immediate evaluation.   Bailey Mech, DNP, NP-c    Bailey Mech, NP 01/19/21 1044

## 2021-01-19 NOTE — ED Triage Notes (Signed)
Pt c/o sore throat since Wednesday, cough and migraine. Pt did COVID test through [facility] and was negative. Pt denies f/n/v/d or other symptoms.

## 2021-01-19 NOTE — Discharge Instructions (Addendum)
You were seen for sore throat and headache and are being treated for allergies.   Take medication as prescribed.  Over-the-counter ibuprofen can also help with  your migraine as needed.  Take care, Dr. Sharlet Salina, NP-c

## 2021-12-30 DIAGNOSIS — F411 Generalized anxiety disorder: Secondary | ICD-10-CM | POA: Insufficient documentation

## 2022-08-27 ENCOUNTER — Encounter: Payer: Self-pay | Admitting: Emergency Medicine

## 2022-08-27 ENCOUNTER — Ambulatory Visit
Admission: EM | Admit: 2022-08-27 | Discharge: 2022-08-27 | Disposition: A | Discharge status: Home / Self Care | Payer: BLUE CROSS/BLUE SHIELD | Attending: Physician Assistant | Admitting: Physician Assistant

## 2022-08-27 DIAGNOSIS — N926 Irregular menstruation, unspecified: Secondary | ICD-10-CM | POA: Insufficient documentation

## 2022-08-27 LAB — PREGNANCY, URINE: Preg Test, Ur: NEGATIVE

## 2022-08-27 NOTE — ED Provider Notes (Signed)
[facility]    CSN: [id number] Arrival date & time: 08/27/22  1447      History   Chief Complaint Chief Complaint  Patient presents with   Vaginal Discharge    HPI Dana Walker is a 26 y.o. female presenting for evaluation after she passed a large clump of tissue vaginally today.  Patient reports that she started her menstrual period today on time.  She says that she passed a sac.  She has never had this happen before.  She denies any concern or possibility of pregnancy though.  Patient reports she has had miscarriages and abortions in the past.  She last had an elective abortion with oral medication back in April but has had regular menstrual period since then.  She denies any heavy bleeding at this time.  She has not had any associated pain.  Denies vaginal discharge or odor.  No concern for STIs reported.  Patient reports feeling normal at this time.  No other complaints.  HPI  History reviewed. No pertinent past medical history.  There are no problems to display for this patient.   Past Surgical History:  Procedure Laterality Date   TONSILLECTOMY      OB History   No obstetric history on file.      Home Medications    Prior to Admission medications   Medication Sig Start Date End Date Taking? Authorizing Provider  fluticasone (FLONASE) 50 MCG/ACT nasal spray Place 2 sprays into both nostrils daily. 01/19/21   Gertie Baron, NP  levocetirizine (XYZAL) 5 MG tablet Take 1 tablet (5 mg total) by mouth every evening. 01/19/21   Gertie Baron, NP    Family History Family History  Problem Relation Age of Onset   Diabetes Mother    Healthy Father     Social History Social History   Tobacco Use   Smoking status: Never   Smokeless tobacco: Never  Vaping Use   Vaping Use: Never used  Substance Use Topics   Alcohol use: Yes   Drug use: Not Currently     Allergies   Ceclor [cefaclor], Penicillins, and Latex   Review of Systems Review of  Systems  Constitutional:  Negative for fatigue and fever.  Cardiovascular:  Negative for chest pain.  Gastrointestinal:  Negative for abdominal pain, nausea and vomiting.  Genitourinary:  Positive for vaginal bleeding. Negative for difficulty urinating, dysuria, flank pain, frequency, genital sores, hematuria, pelvic pain, urgency, vaginal discharge and vaginal pain.  Musculoskeletal:  Negative for back pain.  Skin:  Negative for rash.     Physical Exam Triage Vital Signs ED Triage Vitals  Enc Vitals Group     BP      Pulse      Resp      Temp      Temp src      SpO2      Weight      Height      Head Circumference      Peak Flow      Pain Score      Pain Loc      Pain Edu?      Excl. in [place]?    No data found.  Updated Vital Signs BP 102/69 (BP Location: Left Arm)   Pulse 81   Temp 98.8 F (37.1 C) (Oral)   Resp 16   LMP 08/27/2022   SpO2 100%    Physical Exam Vitals and nursing note reviewed.  Constitutional:  General: She is not in acute distress.    Appearance: Normal appearance. She is not ill-appearing or toxic-appearing.  HENT:     Head: Normocephalic and atraumatic.  Eyes:     General: No scleral icterus.       Right eye: No discharge.        Left eye: No discharge.     Conjunctiva/sclera: Conjunctivae normal.  Cardiovascular:     Rate and Rhythm: Normal rate and regular rhythm.     Heart sounds: Normal heart sounds.  Pulmonary:     Effort: Pulmonary effort is normal. No respiratory distress.     Breath sounds: Normal breath sounds.  Abdominal:     Palpations: Abdomen is soft.     Tenderness: There is no abdominal tenderness. There is no right CVA tenderness or left CVA tenderness.  Musculoskeletal:     Cervical back: Neck supple.  Skin:    General: Skin is dry.  Neurological:     General: No focal deficit present.     Mental Status: She is alert. Mental status is at baseline.     Motor: No weakness.     Gait: Gait normal.  Psychiatric:         Mood and Affect: Mood normal.        Behavior: Behavior normal.        Thought Content: Thought content normal.      UC Treatments / Results  Labs (all labs ordered are listed, but only abnormal results are displayed) Labs Reviewed  PREGNANCY, URINE    EKG   Radiology No results found.  Procedures Procedures (including critical care time)  Medications Ordered in UC Medications - No data to display  Initial Impression / Assessment and Plan / UC Course  I have reviewed the triage vital signs and the nursing notes.  Pertinent labs & imaging results that were available during my care of the patient were reviewed by me and considered in my medical decision making (see chart for details).   26 year old female presents for evaluation after she passed a large clump of tissue vaginally.  Today's first day of her menstrual period.  Denies any heavy bleeding or associated pain.  Denies concern for pregnancy.  Patient shows me the clump of tissue which she has any medication bottle.  It is a piece of wadded up tissue that is not necessarily consistent with a clot of blood.  She has no abdominal tenderness.  Urine pregnancy is negative.  Suspect this could be a uterine/endometrial cast that she passed.  She has no associated pain, heavy bleeding in the urine pregnancy is negative.  We will have her to monitor her condition and if she continues to pass clumps of tissue or have any associated fever, abdominal/back or pelvic pain or heavy bleeding, go to emergency department.  She does not need an AVS at this time.   Final Clinical Impressions(s) / UC Diagnoses   Final diagnoses:  Menstrual abnormality   Discharge Instructions   None    ED Prescriptions   None    PDMP not reviewed this encounter.   Shirlee Latch, PA-C 08/27/22 1711

## 2022-08-27 NOTE — ED Triage Notes (Signed)
Pt started her menstrual period today. A large clump of tissue came out of her vagina. Pt denies any other symptoms.

## 2022-11-23 ENCOUNTER — Emergency Department
Admission: EM | Admit: 2022-11-23 | Discharge: 2022-11-23 | Disposition: A | Discharge status: Home / Self Care | Payer: BLUE CROSS/BLUE SHIELD | Attending: Emergency Medicine | Admitting: Emergency Medicine

## 2022-11-23 DIAGNOSIS — T40711A Poisoning by cannabis, accidental (unintentional), initial encounter: Secondary | ICD-10-CM | POA: Insufficient documentation

## 2022-11-23 DIAGNOSIS — Y900 Blood alcohol level of less than 20 mg/100 ml: Secondary | ICD-10-CM | POA: Diagnosis not present

## 2022-11-23 DIAGNOSIS — T50901A Poisoning by unspecified drugs, medicaments and biological substances, accidental (unintentional), initial encounter: Secondary | ICD-10-CM

## 2022-11-23 DIAGNOSIS — D649 Anemia, unspecified: Secondary | ICD-10-CM | POA: Diagnosis not present

## 2022-11-23 LAB — COMPREHENSIVE METABOLIC PANEL
ALT: 11 U/L (ref 0–44)
AST: 31 U/L (ref 15–41)
Albumin: 3.8 g/dL (ref 3.5–5.0)
Alkaline Phosphatase: 42 U/L (ref 38–126)
Anion gap: 8 (ref 5–15)
BUN: 8 mg/dL (ref 6–20)
CO2: 21 mmol/L — ABNORMAL LOW (ref 22–32)
Calcium: 8.5 mg/dL — ABNORMAL LOW (ref 8.9–10.3)
Chloride: 107 mmol/L (ref 98–111)
Creatinine, Ser: 0.8 mg/dL (ref 0.44–1.00)
GFR, Estimated: >60 mL/min (ref >60)
Glucose, Bld: 125 mg/dL — ABNORMAL HIGH (ref 70–99)
Potassium: 4.3 mmol/L (ref 3.5–5.1)
Sodium: 136 mmol/L (ref 135–145)
Total Bilirubin: 0.6 mg/dL (ref 0.3–1.2)
Total Protein: 7.2 g/dL (ref 6.5–8.1)

## 2022-11-23 LAB — CBC WITH DIFFERENTIAL/PLATELET
Abs Immature Granulocytes: 0.02 10*3/uL (ref 0.00–0.07)
Basophils Absolute: 0 10*3/uL (ref 0.0–0.1)
Basophils Relative: 0 %
Eosinophils Absolute: 0 10*3/uL (ref 0.0–0.5)
Eosinophils Relative: 0 %
HCT: 30.9 % — ABNORMAL LOW (ref 36.0–46.0)
Hemoglobin: 8.7 g/dL — ABNORMAL LOW (ref 12.0–15.0)
Immature Granulocytes: 0 %
Lymphocytes Relative: 12 %
Lymphs Abs: 0.9 10*3/uL (ref 0.7–4.0)
MCH: 19.9 pg — ABNORMAL LOW (ref 26.0–34.0)
MCHC: 28.2 g/dL — ABNORMAL LOW (ref 30.0–36.0)
MCV: 70.7 fL — ABNORMAL LOW (ref 80.0–100.0)
Monocytes Absolute: 0.4 10*3/uL (ref 0.1–1.0)
Monocytes Relative: 6 %
Neutro Abs: 5.9 10*3/uL (ref 1.7–7.7)
Neutrophils Relative %: 82 %
Platelets: 310 10*3/uL (ref 150–400)
RBC: 4.37 MIL/uL (ref 3.87–5.11)
RDW: 18.4 % — ABNORMAL HIGH (ref 11.5–15.5)
WBC: 7.3 10*3/uL (ref 4.0–10.5)
nRBC: 0 % (ref 0.0–0.2)

## 2022-11-23 LAB — URINE DRUG SCREEN, QUALITATIVE (ARMC ONLY)
Amphetamines, Ur Screen: NOT DETECTED
Barbiturates, Ur Screen: NOT DETECTED
Benzodiazepine, Ur Scrn: NOT DETECTED
Cannabinoid 50 Ng, Ur ~~LOC~~: POSITIVE — AB
Cocaine Metabolite,Ur ~~LOC~~: NOT DETECTED
MDMA (Ecstasy)Ur Screen: NOT DETECTED
Methadone Scn, Ur: NOT DETECTED
Opiate, Ur Screen: NOT DETECTED
Phencyclidine (PCP) Ur S: NOT DETECTED
Tricyclic, Ur Screen: NOT DETECTED

## 2022-11-23 LAB — SALICYLATE LEVEL: Salicylate Lvl: <7 mg/dL — ABNORMAL LOW (ref 7.0–30.0)

## 2022-11-23 LAB — ETHANOL: Alcohol, Ethyl (B): <10 mg/dL (ref <10)

## 2022-11-23 LAB — ACETAMINOPHEN LEVEL: Acetaminophen (Tylenol), Serum: <10 ug/mL — ABNORMAL LOW (ref 10–30)

## 2022-11-23 LAB — PREGNANCY, URINE: Preg Test, Ur: NEGATIVE

## 2022-11-23 MED ORDER — ONDANSETRON HCL 4 MG/2ML IJ SOLN
4.0000 mg | Freq: Once | INTRAMUSCULAR | Status: AC
  Administered 2022-11-23: 4 mg via INTRAVENOUS
  Filled 2022-11-23: qty 2

## 2022-11-23 MED ORDER — SODIUM CHLORIDE 0.9 % IV BOLUS (SEPSIS)
1000.0000 mL | Freq: Once | INTRAVENOUS | Status: AC
  Administered 2022-11-23: 1000 mL via INTRAVENOUS

## 2022-11-23 MED ORDER — FERROUS SULFATE 325 (65 FE) MG PO TABS: 325.0000 mg | ORAL_TABLET | Freq: Every day | ORAL | 3 refills | Status: AC

## 2022-11-23 MED ORDER — SODIUM CHLORIDE 0.9 % IV SOLN: 1000.0000 mL | INTRAVENOUS | Status: DC

## 2022-11-23 MED ORDER — ONDANSETRON 4 MG PO TBDP: 4.0000 mg | ORAL_TABLET | Freq: Four times a day (QID) | ORAL | 0 refills | Status: DC | PRN

## 2022-11-23 NOTE — ED Notes (Signed)
Writer stuck twice for blood.
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
## 2023-06-01 ENCOUNTER — Observation Stay
Admission: EM | Admit: 2023-06-01 | Discharge: 2023-06-02 | Disposition: A | Discharge status: Home / Self Care | Payer: BLUE CROSS/BLUE SHIELD | Attending: Obstetrics and Gynecology | Admitting: Obstetrics and Gynecology

## 2023-06-01 ENCOUNTER — Encounter: Payer: Self-pay | Admitting: Emergency Medicine

## 2023-06-01 DIAGNOSIS — Z91040 Latex allergy status: Secondary | ICD-10-CM | POA: Diagnosis not present

## 2023-06-01 DIAGNOSIS — D649 Anemia, unspecified: Secondary | ICD-10-CM | POA: Diagnosis present

## 2023-06-01 DIAGNOSIS — O034 Incomplete spontaneous abortion without complication: Principal | ICD-10-CM | POA: Diagnosis present

## 2023-06-01 DIAGNOSIS — Z79899 Other long term (current) drug therapy: Secondary | ICD-10-CM | POA: Insufficient documentation

## 2023-06-01 DIAGNOSIS — D62 Acute posthemorrhagic anemia: Secondary | ICD-10-CM | POA: Diagnosis not present

## 2023-06-01 DIAGNOSIS — N939 Abnormal uterine and vaginal bleeding, unspecified: Secondary | ICD-10-CM

## 2023-06-01 HISTORY — DX: Anemia, unspecified: D64.9

## 2023-06-01 HISTORY — DX: Anxiety disorder, unspecified: F41.9

## 2023-06-01 HISTORY — DX: Migraine, unspecified, not intractable, without status migrainosus: G43.909

## 2023-06-01 NOTE — ED Provider Notes (Signed)
.     Sharyn Creamer, MD 06/02/23 [phone]

## 2023-06-01 NOTE — ED Provider Notes (Signed)
.     Joelee Snoke, MD 06/02/23 0554  

## 2023-06-01 NOTE — ED Triage Notes (Signed)
Pt arrived via POV with reports of low hemoglobin 5.1, pt states she has been losing a lot of blood, labs verified in care everywhere.   Pt is apprehensive about blood transfusion at this time, has never had one only iron infusions.   Pt reports feeling lightheaded and dizzy as well.

## 2023-06-02 ENCOUNTER — Encounter: Payer: Self-pay | Admitting: Obstetrics and Gynecology

## 2023-06-02 ENCOUNTER — Emergency Department: Payer: BLUE CROSS/BLUE SHIELD

## 2023-06-02 DIAGNOSIS — O034 Incomplete spontaneous abortion without complication: Secondary | ICD-10-CM | POA: Diagnosis present

## 2023-06-02 DIAGNOSIS — D62 Acute posthemorrhagic anemia: Secondary | ICD-10-CM | POA: Diagnosis present

## 2023-06-02 DIAGNOSIS — D649 Anemia, unspecified: Secondary | ICD-10-CM | POA: Diagnosis present

## 2023-06-02 LAB — CBC
HCT: 19.4 % — ABNORMAL LOW (ref 36.0–46.0)
HCT: 23.7 % — ABNORMAL LOW (ref 36.0–46.0)
Hemoglobin: 5.4 g/dL — ABNORMAL LOW (ref 12.0–15.0)
Hemoglobin: 7.3 g/dL — ABNORMAL LOW (ref 12.0–15.0)
MCH: 18.6 pg — ABNORMAL LOW (ref 26.0–34.0)
MCH: 22.3 pg — ABNORMAL LOW (ref 26.0–34.0)
MCHC: 27.8 g/dL — ABNORMAL LOW (ref 30.0–36.0)
MCHC: 30.8 g/dL (ref 30.0–36.0)
MCV: 66.7 fL — ABNORMAL LOW (ref 80.0–100.0)
MCV: 72.3 fL — ABNORMAL LOW (ref 80.0–100.0)
Platelets: 239 10*3/uL (ref 150–400)
Platelets: 320 10*3/uL (ref 150–400)
RBC: 2.91 MIL/uL — ABNORMAL LOW (ref 3.87–5.11)
RBC: 3.28 MIL/uL — ABNORMAL LOW (ref 3.87–5.11)
RDW: 21.7 % — ABNORMAL HIGH (ref 11.5–15.5)
RDW: 25.2 % — ABNORMAL HIGH (ref 11.5–15.5)
WBC: 5.5 10*3/uL (ref 4.0–10.5)
WBC: 6.5 10*3/uL (ref 4.0–10.5)
nRBC: 0 % (ref 0.0–0.2)
nRBC: 0.4 % — ABNORMAL HIGH (ref 0.0–0.2)

## 2023-06-02 LAB — COMPREHENSIVE METABOLIC PANEL
ALT: 13 U/L (ref 0–44)
AST: 15 U/L (ref 15–41)
Albumin: 3.4 g/dL — ABNORMAL LOW (ref 3.5–5.0)
Alkaline Phosphatase: 46 U/L (ref 38–126)
Anion gap: 8 (ref 5–15)
BUN: 7 mg/dL (ref 6–20)
CO2: 23 mmol/L (ref 22–32)
Calcium: 8.7 mg/dL — ABNORMAL LOW (ref 8.9–10.3)
Chloride: 107 mmol/L (ref 98–111)
Creatinine, Ser: 0.69 mg/dL (ref 0.44–1.00)
GFR, Estimated: >60 mL/min (ref >60)
Glucose, Bld: 101 mg/dL — ABNORMAL HIGH (ref 70–99)
Potassium: 3.5 mmol/L (ref 3.5–5.1)
Sodium: 138 mmol/L (ref 135–145)
Total Bilirubin: 0.4 mg/dL (ref 0.3–1.2)
Total Protein: 6.3 g/dL — ABNORMAL LOW (ref 6.5–8.1)

## 2023-06-02 LAB — HCG, QUANTITATIVE, PREGNANCY: hCG, Beta Chain, Quant, S: 1406 m[IU]/mL — ABNORMAL HIGH (ref <5)

## 2023-06-02 LAB — TYPE AND SCREEN: ABO/RH(D): O POS

## 2023-06-02 LAB — PREPARE RBC (CROSSMATCH)

## 2023-06-02 LAB — ABO/RH: ABO/RH(D): O POS

## 2023-06-02 MED ORDER — SODIUM CHLORIDE 0.9 % IV SOLN: 10.0000 mL/h | Freq: Once | INTRAVENOUS | Status: DC

## 2023-06-02 MED ORDER — MISOPROSTOL 200 MCG PO TABS: 400.0000 ug | ORAL_TABLET | Freq: Every day | ORAL | 0 refills | Status: DC | PRN

## 2023-06-02 MED ORDER — ACETAMINOPHEN 500 MG PO TABS
1000.0000 mg | ORAL_TABLET | Freq: Four times a day (QID) | ORAL | Status: DC | PRN
  Administered 2023-06-02: 1000 mg via ORAL
  Filled 2023-06-02: qty 2

## 2023-06-02 MED ORDER — IBUPROFEN 600 MG PO TABS: 600.0000 mg | ORAL_TABLET | Freq: Four times a day (QID) | ORAL | 0 refills | Status: DC | PRN

## 2023-06-02 MED ORDER — DEXTROSE-SODIUM CHLORIDE 5-0.45 % IV SOLN: INTRAVENOUS | Status: DC

## 2023-06-02 NOTE — ED Notes (Signed)
Dr. Jackson at bedside

## 2023-06-02 NOTE — H&P (Signed)
GYNECOLOGY ADMISSION HISTORY AND PHYSICAL NOTE    Attending Provider: Sharyn Creamer, MD   Dana Walker [id number] 06/02/2023 4:21 AM    Chief Complaint:   Dana Walker is a 27 y.o. [phone] premenopausal female seen at the request of Dr. Sharyn Creamer for evaluation of severe, acute blood loss anemia as a result of an incomplete spontaneous abortion.    History of Present Ilness:   She states that her LMP was in late March.  She had a positive pregnancy test at the end of April with a quant hCG in the 400s.  For the past two weeks she has been having off and on very heavy bleeding.  She made an appointment for yesterday with her PCP.  By the time she had that appointment her bleeding had subsided substantially.  However, she had a CBC yesterday that showed a hemoglobin of 5.1.  She was instructed to go to the ER for this.  She denies current, heavy vaginal bleeding.  She denies cramping and pain.  She denies current headache, shortness of breath, feeling lightheaded, or other concerning symptoms.  She states that she has had multiple pregnancies that have ended in loss (a couple of terminations as well).  She has one 66 year-old son.   She has a history of chronic anemia that appears to be related to blood loss with menstruation. She has taken PO iron and has had IV iron infusions. She states either types of these forms of iron cause her bleeding to be heavier. She states that the IV iron infusion caused her to nearly die. Her last hemoglobin in January was 9.5.   Past Medical History:  Diagnosis Date   Anemia    Anxiety    Migraines    Past Surgical History:  Procedure Laterality Date   DILATION AND CURETTAGE OF UTERUS     TONSILLECTOMY     Allergies  Allergen Reactions   Ferrous Gluconate Anaphylaxis    Developed chest tightness, dyspnea, urticaria. Given dose of IM epi.   Penicillins Diarrhea and Hives   Ceclor [Cefaclor] Nausea Only   Latex Rash   Prior to Admission medications    Medication Sig Start Date End Date Taking? Authorizing Provider  ferrous sulfate 325 (65 FE) MG tablet Take 1 tablet (325 mg total) by mouth daily. 11/23/22 11/23/23  Ward, Layla Maw, DO  hydrOXYzine (ATARAX) 25 MG tablet Take 25 mg by mouth 3 (three) times daily as needed for anxiety.    [provider]  rizatriptan (MAXALT) 5 MG tablet Take 5 mg by mouth as needed for migraine. May repeat in 2 hours if needed    [provider]    Obstetric History: She is a [id number] female s/p SVD x 1.    Social History:  She  reports that she has never smoked. She has never used smokeless tobacco. She reports current alcohol use. She reports that she does not currently use drugs.  Family History:  family history includes Diabetes in her mother; Healthy in her father.   Review of Systems  Constitutional: Negative.   HENT: Negative.    Eyes: Negative.   Respiratory: Negative.    Cardiovascular: Negative.   Gastrointestinal: Negative.   Genitourinary: Negative.        See HPI  Musculoskeletal: Negative.   Skin: Negative.   Neurological: Negative.   Psychiatric/Behavioral: Negative.       Objective    BP 103/70 (BP Location: Right Arm)   Pulse  84   Temp 98.2 F (36.8 C) (Oral)   Resp 15   Ht 5\' 6"  (1.676 m)   Wt 59.9 kg   LMP 03/08/2023   SpO2 100%   BMI 21.31 kg/m  Physical Exam Constitutional:      General: She is not in acute distress.    Appearance: Normal appearance. She is well-developed.     Comments: Lying in bed comfortable.  She has a unit of pRBCs hanging currently  HENT:     Head: Normocephalic and atraumatic.  Eyes:     General: No scleral icterus.    Conjunctiva/sclera: Conjunctivae normal.  Cardiovascular:     Rate and Rhythm: Normal rate and regular rhythm.     Heart sounds: No murmur heard.    No friction rub. No gallop.  Pulmonary:     Effort: Pulmonary effort is normal. No respiratory distress.     Breath sounds: Normal breath sounds. No  wheezing or rales.  Abdominal:     General: Bowel sounds are normal. There is no distension.     Palpations: Abdomen is soft. There is no mass.     Tenderness: There is no abdominal tenderness. There is no guarding or rebound.  Musculoskeletal:        General: Normal range of motion.     Cervical back: Normal range of motion and neck supple.  Neurological:     General: No focal deficit present.     Mental Status: She is alert and oriented to person, place, and time.     Cranial Nerves: No cranial nerve deficit.  Skin:    General: Skin is warm and dry.     Findings: No erythema.  Psychiatric:        Mood and Affect: Mood normal.        Behavior: Behavior normal.        Judgment: Judgment normal.      Laboratory Results:   Lab Results  Component Value Date   WBC 6.5 06/02/2023   RBC 2.91 (L) 06/02/2023   HGB 5.4 (L) 06/02/2023   HCT 19.4 (L) 06/02/2023   PLT 320 06/02/2023   NA 138 06/02/2023   K 3.5 06/02/2023   CREATININE 0.69 06/02/2023   Imaging Results US OB LESS THAN 14 WEEKS WITH OB TRANSVAGINAL  Result Date: 06/02/2023 CLINICAL DATA:  Vaginal bleeding x2 weeks. EXAM: OBSTETRIC <14 WK [place] AND TRANSVAGINAL OB US TECHNIQUE: Both transabdominal and transvaginal ultrasound examinations were performed for complete evaluation of the gestation as well as the maternal uterus, adnexal regions, and pelvic cul-de-sac. Transvaginal technique was performed to assess early pregnancy. COMPARISON:  None Available. FINDINGS: Intrauterine gestational sac: None Yolk sac:  Not Visualized. Embryo:  Not Visualized. Cardiac Activity: Not Visualized. Heart Rate: N/A  bpm Subchorionic hemorrhage:  None visualized. Maternal uterus/adnexae: The uterus measures 8.14 cm x 5.02 cm x 6.34 cm (volume 135.65 mL). The endometrium measures 14.4 mm in thickness and is heterogeneous in appearance. Flow is noted within the endometrium on color Doppler evaluation. The right ovary measures 2.32 cm x 1.43 cm x 1.35  cm (volume 2.35 mL) and is normal in appearance. The left ovary measures 3.17 cm x 1.16 cm x 2.21 cm (volume 4.26 mL) and is normal in appearance. A trace amount of pelvic free fluid is noted. IMPRESSION: 1. No evidence of an intrauterine pregnancy. 2. Thick, heterogeneous endometrium with additional findings consistent with retained products of conception. Electronically Signed   By: Demetrius Revel.D.  On: 06/02/2023 02:08     Assessment & Plan   Dana Walker is a 27 y.o. [id number] premenopausal female with an incomplete spontaneous abortion being seen in consultation.  She is severely anemic, though asymptomatic currently.  A long discussion was held regarding treatment options.  We discussed that since we do not have a clear picture of where her pregnancy was when she started bleeding (~10 weeks?), her current remaining products of conception are difficult to interpret. We discussed conservative management. We discussed that the benefits of this could be that if she has passed a significant portion of the pregnancy to this point this may not cause her to have very heavy bleeding to the point where she would need to present for emergency care.  We also discussed treatment with misoprostol to help her pass her remaining products of conception instead of awaiting the passage of these products spontaneously. The added benefit of this approach would be a better sense of timing to her passing of the products. We discussed that my biggest concern in either of these approaches would be that since she is already starting out with low blood counts that she has a very high risk of needing emergency care for her blood loss again with additional need for blood products and possibly surgery.  The last option we discussed was a surgical option with a D&C. We discussed the downsides of this, which would be cost and the risks that accompany a procedure, including additional bleeding, risk of infection, risk with  anesthesia, etc.  The biggest benefit of a D&C, which was my recommendation, was that the remaining products of conception could be removed in a controlled environment and if additional blood was needed, that could be handled in a controlled fashion. Additionally, once through the usually short procedure she would be done with any significant bleeding in the overwhelming majority of cases and that while these surgeries do carry some risk, most of these cases go quite well.  After considering her options, she would like to try to medication, understanding the risks outlined above.  I let her know that I didn't know whether her bleeding could get to the point where it would be life threatening. However, she does have an increased risk of having to come back to the ER due to almost any significant blood loss, given that she will start out at a very low level of blood count. She voiced understanding of this and the desire for misoprostol treatment.    - will admit for blood administration and observation of bleeding in the short term with plan for discharge later today once blood administration is complete  and she has demonstrated clinically stability.   - will need close outpatient follow up to try to reduce the risk of need for ER treatment again.  - Maintain NPO for now.   For this ER visit, I had to review clinic notes, multiple lab  values, review the ultrasound report (and images).    Thomasene Mohair, MD, [facility] 06/02/2023 4:38 AM

## 2023-06-02 NOTE — ED Provider Notes (Signed)
[facility] Provider Note    Event Date/Time   First MD Initiated Contact with Patient 06/01/23 2355     (approximate)   History   Anemia   HPI  Dana Walker is a 27 y.o. female reports that she believes she is around [redacted] weeks pregnant started to have a miscarriage about 2 weeks ago.  Seen by Kateri Mc in [place]  She got seen by her doctor and they called her because her blood count was low around 5.1.    Reports that she was about [redacted] weeks pregnant and then 2 weeks ago started having clots and heavy bleeding.  Her doctors been following her for concerns that she had a miscarriage but has been bleeding for 2 weeks since.  The bleeding has slowly tapered off today.  No lightheadedness chest pain or weakness     Physical Exam   Triage Vital Signs: ED Triage Vitals [06/01/23 2051]  Enc Vitals Group     BP 111/66     Pulse Rate (!) 105     Resp 16     Temp 98.5 F (36.9 C)     Temp Source Oral     SpO2 100 %     Weight 132 lb (59.9 kg)     Height 5\' 6"  (1.676 m)     Head Circumference      Peak Flow      Pain Score 0     Pain Loc      Pain Edu?      Excl. in GC?     Most recent vital signs: Vitals:   06/02/23 0430 06/02/23 0450  BP: 96/67 97/66  Pulse: 71 78  Resp: 19 16  Temp:  98.4 F (36.9 C)  SpO2: 100% 100%     General: Awake, no distress.  CV:  Good peripheral perfusion.  Resp:  Normal effort.  Abd:  No distention.  Soft nontender nondistended Other:     ED Results / Procedures / Treatments   Labs (all labs ordered are listed, but only abnormal results are displayed) Labs Reviewed  CBC - Abnormal; Notable for the following components:      Result Value   RBC 2.91 (*)    Hemoglobin 5.4 (*)    HCT 19.4 (*)    MCV 66.7 (*)    MCH 18.6 (*)    MCHC 27.8 (*)    RDW 21.7 (*)    All other components within normal limits  HCG, QUANTITATIVE, PREGNANCY - Abnormal; Notable for the following components:   hCG, Beta Chain,  Quant, S 1,406 (*)    All other components within normal limits  COMPREHENSIVE METABOLIC PANEL - Abnormal; Notable for the following components:   Glucose, Bld 101 (*)    Calcium 8.7 (*)    Total Protein 6.3 (*)    Albumin 3.4 (*)    All other components within normal limits  CBC  TYPE AND SCREEN  PREPARE RBC (CROSSMATCH)  ABO/RH  ABO/RH     EKG     RADIOLOGY  US OB LESS THAN 14 WEEKS WITH OB TRANSVAGINAL  Result Date: 06/02/2023 CLINICAL DATA:  Vaginal bleeding x2 weeks. EXAM: OBSTETRIC <14 WK [place] AND TRANSVAGINAL OB US TECHNIQUE: Both transabdominal and transvaginal ultrasound examinations were performed for complete evaluation of the gestation as well as the maternal uterus, adnexal regions, and pelvic cul-de-sac. Transvaginal technique was performed to assess early pregnancy. COMPARISON:  None Available. FINDINGS: Intrauterine gestational sac:  None Yolk sac:  Not Visualized. Embryo:  Not Visualized. Cardiac Activity: Not Visualized. Heart Rate: N/A  bpm Subchorionic hemorrhage:  None visualized. Maternal uterus/adnexae: The uterus measures 8.14 cm x 5.02 cm x 6.34 cm (volume 135.65 mL). The endometrium measures 14.4 mm in thickness and is heterogeneous in appearance. Flow is noted within the endometrium on color Doppler evaluation. The right ovary measures 2.32 cm x 1.43 cm x 1.35 cm (volume 2.35 mL) and is normal in appearance. The left ovary measures 3.17 cm x 1.16 cm x 2.21 cm (volume 4.26 mL) and is normal in appearance. A trace amount of pelvic free fluid is noted. IMPRESSION: 1. No evidence of an intrauterine pregnancy. 2. Thick, heterogeneous endometrium with additional findings consistent with retained products of conception. Electronically Signed   By: Aram Candela M.D.   On: 06/02/2023 02:08      PROCEDURES:  Critical Care performed: Yes, see critical care procedure note(s)  Procedures CRITICAL CARE Performed by: Sharyn Creamer   Total critical care time: 30  minutes  Critical care time was exclusive of separately billable procedures and treating other patients.  Critical care was necessary to treat or prevent imminent or life-threatening deterioration.  Critical care was time spent personally by me on the following activities: development of treatment plan with patient and/or surrogate as well as nursing, discussions with consultants, evaluation of patient's response to treatment, examination of patient, obtaining history from patient or surrogate, ordering and performing treatments and interventions, ordering and review of laboratory studies, ordering and review of radiographic studies, pulse oximetry and re-evaluation of patient's condition.   MEDICATIONS ORDERED IN ED: Medications  0.9 %  sodium chloride infusion (0 mL/hr Intravenous Hold 06/02/23 0322)  dextrose 5 % and 0.45 % NaCl infusion (has no administration in time range)     IMPRESSION / MDM / ASSESSMENT AND PLAN / ED COURSE  I reviewed the triage vital signs and the nursing notes.                              Differential diagnosis includes, but is not limited to, acute anemia secondary to obstetric or gynecologic bleeding.  The patient has a known pregnancy, and she began bleeding about 2 weeks ago.  She tells me that the [facility] notified her that they felt that she was likely going through miscarriage.  She saw them in follow-up yesterday and received phone call that her hemoglobin is quite low.  Iron transfusion in the past have caused her GI upset.  We discussed risks benefits and alternatives to receiving blood product today given the severity of her anemia, patient is agreeable to proceed with blood transfusion.  She does not appear acutely hemodynamically unstable but has a critically low hemoglobin.  Patient's presentation is most consistent with acute complicated illness / injury requiring diagnostic workup.   The patient is on the cardiac monitor to evaluate for evidence of  arrhythmia and/or significant heart rate changes.   Patient seen by Dr. Jean Rosenthal and admitted to his service     FINAL CLINICAL IMPRESSION(S) / ED DIAGNOSES   Final diagnoses:  Vagina bleeding     Rx / DC Orders   ED Discharge Orders     None        Note:  This document was prepared using Dragon voice recognition software and may include unintentional dictation errors.   Sharyn Creamer, MD 06/02/23 [phone]

## 2023-06-03 LAB — TYPE AND SCREEN
Antibody Screen: NEGATIVE
Unit division: 0
Unit division: 0

## 2023-06-03 LAB — BPAM RBC
Blood Product Expiration Date: 202407312359
Blood Product Expiration Date: 202407312359
ISSUE DATE / TIME: 202406250254
ISSUE DATE / TIME: 202406250624
Unit Type and Rh: 5100
Unit Type and Rh: 5100

## 2023-06-03 NOTE — Discharge Summary (Cosign Needed)
DC Summary Discharge Summary   Patient ID: Dana Walker [id number] 27 y.o. [birth year]  Admit date: 06/01/2023  Discharge date: 06/03/2023  Principal Diagnoses:  Incomplete spontaneous abortion   Secondary Diagnoses:  Severe anemia  Acute on chronic blood loss anemia   Procedures performed during the hospitalization:  Blood transfusion - 2 units pRBC  HPI: She states that her LMP was in late March.  She had a positive pregnancy test at the end of April with a quant hCG in the 400s.  For the past two weeks she has been having off and on very heavy bleeding.  She made an appointment for yesterday with her PCP.  By the time she had that appointment her bleeding had subsided substantially.  However, she had a CBC yesterday that showed a hemoglobin of 5.1.  She was instructed to go to the ER for this.  She denies current, heavy vaginal bleeding.  She denies cramping and pain.  She denies current headache, shortness of breath, feeling lightheaded, or other concerning symptoms.  She states that she has had multiple pregnancies that have ended in loss (a couple of terminations as well).  She has one 11 year-old son.   She has a history of chronic anemia that appears to be related to blood loss with menstruation. She has taken PO iron and has had IV iron infusions. She states either types of these forms of iron cause her bleeding to be heavier. She states that the IV iron infusion caused her to nearly die. Her last hemoglobin in January was 9.5.  Past Medical History:  Diagnosis Date   Anemia    Anxiety    Migraines     Past Surgical History:  Procedure Laterality Date   DILATION AND CURETTAGE OF UTERUS     TONSILLECTOMY      Allergies  Allergen Reactions   Ferrous Gluconate Anaphylaxis    Developed chest tightness, dyspnea, urticaria. Given dose of IM epi.   Penicillins Diarrhea and Hives   Ceclor [Cefaclor] Nausea Only   Latex Rash    Social History   Tobacco Use   Smoking  status: Never   Smokeless tobacco: Never  Vaping Use   Vaping Use: Never used  Substance Use Topics   Alcohol use: Yes   Drug use: Not Currently    Family History  Problem Relation Age of Onset   Diabetes Mother    Healthy Father     Hospital Course:  Dana Walker received 2 units of pRBC. She was overall stable and asymptomatic with the exception of feeling fatigued and a decrease in activity tolerance. She was offered expectant management (not recommended), medical management with misoprostol, and surgical management with D&C (recommended).  Dana Walker opted for blood transfusion and medical management with misoprostol.  Hgb improved to 7.3 after 2 units of pRBC.  Her vital signs remained stable.  She reported an improvement in her fatigue and activity tolerance.  She was deemed stable for discharge and close follow up outpatient.   Discharge Exam: BP 107/70 (BP Location: Right Arm)   Pulse 87   Temp 98.4 F (36.9 C) (Oral)   Resp 20   Ht 5\' 6"  (1.676 m)   Wt 59.9 kg   LMP 03/08/2023   SpO2 100%   BMI 21.31 kg/m  Physical Exam Constitutional:      Appearance: Normal appearance.  Pulmonary:     Effort: Pulmonary effort is normal.  Abdominal:     Palpations: Abdomen is soft.  Musculoskeletal:     Cervical back: Normal range of motion.  Neurological:     Mental Status: She is alert and oriented to person, place, and time.  Skin:    General: Skin is warm and dry.     Capillary Refill: Capillary refill takes less than 2 seconds.  Psychiatric:        Mood and Affect: Mood normal.      Condition at Discharge: Stable  Complications affecting treatment: None  Discharge Medications:  Allergies as of 06/02/2023       Reactions   Ferrous Gluconate Anaphylaxis   Developed chest tightness, dyspnea, urticaria. Given dose of IM epi.   Penicillins Diarrhea, Hives   Ceclor [cefaclor] Nausea Only   Latex Rash        Medication List     TAKE these medications    ferrous  sulfate 325 (65 FE) MG tablet Take 1 tablet (325 mg total) by mouth daily.   fluticasone 50 MCG/ACT nasal spray Commonly known as: FLONASE Place 2 sprays into both nostrils daily.   hydrOXYzine 25 MG tablet Commonly known as: ATARAX Take 25 mg by mouth 3 (three) times daily as needed for anxiety.   ibuprofen 600 MG tablet Commonly known as: ADVIL Take 1 tablet (600 mg total) by mouth every 6 (six) hours as needed.   levocetirizine 5 MG tablet Commonly known as: Xyzal Take 1 tablet (5 mg total) by mouth every evening.   misoprostol 200 MCG tablet Commonly known as: CYTOTEC Place 2 tablets (400 mcg total) vaginally daily as needed for up to 2 doses (Early pregnancy loss). Place 400 mcg vaginally.  May repeat in 24-72 hours as needed for vaginal bleeding.   ondansetron 4 MG disintegrating tablet Commonly known as: ZOFRAN-ODT Take 1 tablet (4 mg total) by mouth every 6 (six) hours as needed for nausea or vomiting.   rizatriptan 5 MG tablet Commonly known as: MAXALT Take 5 mg by mouth as needed for migraine. May repeat in 2 hours if needed         Follow-up arrangements:   Follow-up Information     Conard Novak, MD. Schedule an appointment as soon as possible for a visit in 1 week(s).   Specialty: Obstetrics and Gynecology Why: follow up hospital visit Contact information: [address] [place] [address] [phone]                  Discharge Disposition: Discharge disposition: 01-Home or Self Care    Signed:  Margaretmary Eddy, CNM Certified Nurse Midwife [facility] OB[facility]N [facility]    ADDENDUM: I personally evaluted Dana Walker and offered her a D&C to remove the remaining products of conception as she would be at risk for needing further treatment with the use of misoprostol. After a comprehensive discussion of the risks and benefits of doing nothing (observing for now), using misoprostol, and a D&C, she  elected misoprostol.  Very strict precautions were given to her. Specifically, she was asked to come to the clinic or return to the ER, if she began having heavy bleeding, given that she would be starting at such a low starting point.  She voiced understanding of this and agreed to return.    Thomasene Mohair, MD, [facility] 06/04/2023 3:46 PM

## 2023-06-17 ENCOUNTER — Other Ambulatory Visit: Payer: Self-pay | Admitting: Obstetrics and Gynecology

## 2023-06-17 DIAGNOSIS — O034 Incomplete spontaneous abortion without complication: Secondary | ICD-10-CM

## 2023-06-19 ENCOUNTER — Ambulatory Visit
Admission: RE | Admit: 2023-06-19 | Discharge: 2023-06-19 | Disposition: A | Discharge status: Home / Self Care | Payer: BLUE CROSS/BLUE SHIELD | Source: Ambulatory Visit | Attending: Obstetrics and Gynecology | Admitting: Obstetrics and Gynecology

## 2023-06-19 ENCOUNTER — Encounter: Payer: Self-pay | Admitting: Emergency Medicine

## 2023-06-19 VITALS — BP 112/66 | HR 92 | Temp 98.5°F | Resp 18

## 2023-06-19 DIAGNOSIS — D649 Anemia, unspecified: Secondary | ICD-10-CM | POA: Insufficient documentation

## 2023-06-19 DIAGNOSIS — O034 Incomplete spontaneous abortion without complication: Secondary | ICD-10-CM | POA: Insufficient documentation

## 2023-06-19 LAB — BPAM RBC
Blood Product Expiration Date: 202408152359
Unit Type and Rh: 5100

## 2023-06-19 LAB — TYPE AND SCREEN
ABO/RH(D): O POS
Antibody Screen: NEGATIVE

## 2023-06-19 LAB — PREPARE RBC (CROSSMATCH)

## 2023-06-19 MED ORDER — ACETAMINOPHEN 325 MG PO TABS
650.0000 mg | ORAL_TABLET | Freq: Once | ORAL | Status: AC
  Administered 2023-06-19: 650 mg via ORAL

## 2023-06-19 MED ORDER — DIPHENHYDRAMINE HCL 50 MG/ML IJ SOLN
INTRAMUSCULAR | Status: AC
  Filled 2023-06-19: qty 1

## 2023-06-19 MED ORDER — SODIUM CHLORIDE 0.9% IV SOLUTION: Freq: Once | INTRAVENOUS | Status: DC

## 2023-06-19 MED ORDER — DIPHENHYDRAMINE HCL 50 MG/ML IJ SOLN
50.0000 mg | Freq: Once | INTRAMUSCULAR | Status: AC
  Administered 2023-06-19: 50 mg via INTRAVENOUS

## 2023-06-19 MED ORDER — ACETAMINOPHEN 325 MG PO TABS
ORAL_TABLET | ORAL | Status: AC
  Filled 2023-06-19: qty 2

## 2023-06-19 NOTE — Progress Notes (Signed)
Patient arrived on unit for blood transfusion. Blood consent signed by patient, witnessed by this nurse. Placed in scanning. IV placed. Normal saline infusing per order. Patient given pre medications of IV benadryl and PO tylenol. Patient became anxious with administration of benadryl. Vital signs remain stable. The blood transfusion was started without event.

## 2023-06-20 LAB — TYPE AND SCREEN: Unit division: 0

## 2023-06-20 LAB — BPAM RBC: ISSUE DATE / TIME: 202407121703

## 2023-06-25 ENCOUNTER — Ambulatory Visit
Admission: RE | Admit: 2023-06-25 | Payer: BLUE CROSS/BLUE SHIELD | Source: Home / Self Care | Admitting: Obstetrics and Gynecology

## 2023-06-25 ENCOUNTER — Encounter: Admission: RE | Payer: Self-pay | Source: Home / Self Care

## 2023-06-25 DIAGNOSIS — O034 Incomplete spontaneous abortion without complication: Secondary | ICD-10-CM

## 2023-06-25 SURGERY — DILATION AND EVACUATION, UTERUS
Anesthesia: Choice

## 2023-06-25 MED ORDER — DOXYCYCLINE HYCLATE 100 MG IV SOLR
200.0000 mg | INTRAVENOUS | Status: DC
  Filled 2023-06-25: qty 200

## 2023-07-20 ENCOUNTER — Encounter
Admission: RE | Admit: 2023-07-20 | Discharge: 2023-07-20 | Disposition: A | Discharge status: Home / Self Care | Payer: BLUE CROSS/BLUE SHIELD | Source: Ambulatory Visit | Attending: Obstetrics and Gynecology | Admitting: Obstetrics and Gynecology

## 2023-07-20 VITALS — Ht 66.0 in | Wt 128.0 lb

## 2023-07-20 DIAGNOSIS — D62 Acute posthemorrhagic anemia: Secondary | ICD-10-CM

## 2023-07-20 DIAGNOSIS — Z8619 Personal history of other infectious and parasitic diseases: Secondary | ICD-10-CM | POA: Diagnosis not present

## 2023-07-20 DIAGNOSIS — D649 Anemia, unspecified: Secondary | ICD-10-CM

## 2023-07-20 DIAGNOSIS — Z01812 Encounter for preprocedural laboratory examination: Secondary | ICD-10-CM

## 2023-07-20 DIAGNOSIS — O034 Incomplete spontaneous abortion without complication: Secondary | ICD-10-CM

## 2023-07-20 DIAGNOSIS — O99019 Anemia complicating pregnancy, unspecified trimester: Secondary | ICD-10-CM | POA: Insufficient documentation

## 2023-07-20 HISTORY — DX: Other specified postprocedural states: Z98.890

## 2023-07-20 LAB — CBC
HCT: 24.5 % — ABNORMAL LOW (ref 36.0–46.0)
Hemoglobin: 6.7 g/dL — ABNORMAL LOW (ref 12.0–15.0)
MCH: 18.4 pg — ABNORMAL LOW (ref 26.0–34.0)
MCHC: 27.3 g/dL — ABNORMAL LOW (ref 30.0–36.0)
MCV: 67.3 fL — ABNORMAL LOW (ref 80.0–100.0)
Platelets: 340 10*3/uL (ref 150–400)
RBC: 3.64 MIL/uL — ABNORMAL LOW (ref 3.87–5.11)
RDW: 24.3 % — ABNORMAL HIGH (ref 11.5–15.5)
WBC: 4.8 10*3/uL (ref 4.0–10.5)
nRBC: 0 % (ref 0.0–0.2)

## 2023-07-20 LAB — BASIC METABOLIC PANEL
Anion gap: 8 (ref 5–15)
BUN: 7 mg/dL (ref 6–20)
CO2: 21 mmol/L — ABNORMAL LOW (ref 22–32)
Calcium: 8.9 mg/dL (ref 8.9–10.3)
Chloride: 111 mmol/L (ref 98–111)
Creatinine, Ser: 0.69 mg/dL (ref 0.44–1.00)
GFR, Estimated: >60 mL/min (ref >60)
Glucose, Bld: 83 mg/dL (ref 70–99)
Potassium: 3.5 mmol/L (ref 3.5–5.1)
Sodium: 140 mmol/L (ref 135–145)

## 2023-07-20 LAB — BPAM RBC
Blood Product Expiration Date: 202408132359
Unit Type and Rh: 9500

## 2023-07-20 LAB — TYPE AND SCREEN
ABO/RH(D): O POS
Antibody Screen: NEGATIVE
Unit division: 0

## 2023-07-20 LAB — PREPARE RBC (CROSSMATCH)

## 2023-07-20 MED ORDER — DOXYCYCLINE HYCLATE 100 MG IV SOLR
200.0000 mg | INTRAVENOUS | Status: AC
  Administered 2023-07-21: 200 mg via INTRAVENOUS
  Filled 2023-07-20: qty 200

## 2023-07-20 MED ORDER — ORAL CARE MOUTH RINSE: 15.0000 mL | Freq: Once | OROMUCOSAL | Status: AC

## 2023-07-20 MED ORDER — FAMOTIDINE 20 MG PO TABS
20.0000 mg | ORAL_TABLET | Freq: Once | ORAL | Status: AC
  Administered 2023-07-21: 20 mg via ORAL
  Filled 2023-07-20: qty 1

## 2023-07-20 MED ORDER — CHLORHEXIDINE GLUCONATE 0.12 % MT SOLN
15.0000 mL | Freq: Once | OROMUCOSAL | Status: AC
  Administered 2023-07-21: 15 mL via OROMUCOSAL

## 2023-07-20 MED ORDER — LACTATED RINGERS IV SOLN: INTRAVENOUS | Status: DC

## 2023-07-20 NOTE — Patient Instructions (Addendum)
Your procedure is scheduled on: Tuesday August 13  Report to the Registration Desk on the 1st floor of the [facility] To find out your arrival time, please call [phone] between 1PM - 3PM on:  Monday August 12 If your arrival time is 6:00 am, do not arrive before that time as the [facility] entrance doors do not open until 6:00 am.  REMEMBER: Instructions that are not followed completely may result in serious medical risk, up to and including death; or upon the discretion of your surgeon and anesthesiologist your surgery may need to be rescheduled.  Do not eat food after midnight the night before surgery.  No gum chewing or hard candies.  You may however, drink CLEAR liquids up to 2 hours before you are scheduled to arrive for your surgery. Do not drink anything within 2 hours of your scheduled arrival time.  Clear liquids include: - water  - apple juice without pulp - gatorade (not RED colors) - black coffee or tea (Do NOT add milk or creamers to the coffee or tea) Do NOT drink anything that is not on this list.  In addition, your doctor has ordered for you to drink the provided:  Ensure Pre-Surgery Clear Carbohydrate Drink   Drinking this carbohydrate drink up to two hours before surgery helps to reduce insulin resistance and improve patient outcomes. Please complete drinking 2 hours before scheduled arrival time.  One week prior to surgery: Stop Anti-inflammatories (NSAIDS) such as Advil, Aleve, Ibuprofen, Motrin, Naproxen, Naprosyn and Aspirin based products such as Excedrin, Goody's Powder, BC Powder. Stop ANY OVER THE COUNTER supplements until after surgery. ferrous sulfate   You may however, continue to take Tylenol if needed for pain up until the day of surgery.  Continue taking all prescribed medications with the exception of the following:  Follow recommendations from Cardiologist or PCP regarding stopping blood thinners.  TAKE ONLY THESE MEDICATIONS THE  MORNING OF SURGERY WITH A SIP OF WATER:  hydrOXYzine (ATARAX) if needed for anxiety   No Alcohol for 24 hours before or after surgery.  No Smoking including e-cigarettes for 24 hours before surgery.  No chewable tobacco products for at least 6 hours before surgery.  No nicotine patches on the day of surgery.  Do not use any "recreational" drugs for at least a week (preferably 2 weeks) before your surgery.  Please be advised that the combination of cocaine and anesthesia may have negative outcomes, up to and including death. If you test positive for cocaine, your surgery will be cancelled.  On the morning of surgery brush your teeth with toothpaste and water, you may rinse your mouth with mouthwash if you wish. Do not swallow any toothpaste or mouthwash.  Do not wear jewelry, make-up, hairpins, clips or nail polish.  Do not wear lotions, powders, or perfumes.   Do not shave body hair from the neck down 48 hours before surgery.  Do not bring valuables to the hospital. [facility] is not responsible for any missing/lost belongings or valuables.   Notify your doctor if there is any change in your medical condition (cold, fever, infection).  Wear comfortable clothing (specific to your surgery type) to the hospital.  After surgery, you can help prevent lung complications by doing breathing exercises.  Take deep breaths and cough every 1-2 hours.     If you are being discharged the day of surgery, you will not be allowed to drive home. You will need a responsible individual to drive  you home and stay with you for 24 hours after surgery.   If you are taking public transportation, you will need to have a responsible individual with you.  Please call the Pre-admissions Testing Dept. at [phone] if you have any questions about these instructions.  Surgery Visitation Policy:  Patients having surgery or a procedure may have two visitors.  Children under the age of 44 must have an  adult with them who is not the patient.

## 2023-07-20 NOTE — Progress Notes (Signed)
  [facility] Perioperative Services: Pre-Admission/Anesthesia Testing  Abnormal Lab Notification   Date: 07/20/23  Name: Dana Walker MRN:   [id number]  Re: Abnormal labs noted during PAT appointment   Notified:    Provider Name Provider Role Notification Mode  Conard Novak, MD OB/GYN Communicated with provider via [facility]   ABNORMAL LAB VALUE(S):   Lab Results  Component Value Date  HGB 6.7 (L) 07/20/2023  HCT 24.5 (L) 07/20/2023  MCV 67.3 (L) 07/20/2023  MCH 18.4 (L) 07/20/2023  MCHC 27.3 (L) 07/20/2023  RDW 24.3 (H) 07/20/2023   Clinical Information and Notes:  [name] is scheduled for a DILATATION AND EVACUATION on 07/21/2023.   Patient anemic and status post multiple PRBC transfusions; 1 unit PRBCs transfusion on 06/02/2023, 06/03/2023, and 06/22/2023. Last hemoglobin level was 5.8 g/dL on 19/14/7829.  Patient's type and screen was updated today. Spoke with Dr. Jean Rosenthal regarding results and need for preoperative blood transfusion. MD agrees, as blood loss is anticipated during the procedure. Orders placed to have 1 unit PRBCs started upon arrival to [facility]. [facility] RN aware that patient require PRBC transfusion prior to procedure. Patient originally asked to be here by 0800. With the need for blood, per direction of the [facility] staff, arrival time being changed to around 0645 (no later than 0700).   Call placed to patient to discuss. We reviewed her current hemoglobin value and need for blood prior to her procedure. She is aware that additional blood products may be required based on her response to the transfusion and the amount of blood that she loses during her procedure tomorrow. Patient verbalizes understanding and consent. Official blood transfusion consent will need to be obtained by [facility] staff.   Questions fielded to satisfaction. Wished patient the best of luck with her procedure. Reassurance provided.  Quentin Mulling, MSN, APRN, FNP-C, CEN [facility]  Peri-operative Services Nurse Practitioner Phone: [phone] Fax: [phone] 07/20/23 5:17 PM

## 2023-07-21 ENCOUNTER — Ambulatory Visit: Payer: BLUE CROSS/BLUE SHIELD | Admitting: Urgent Care

## 2023-07-21 ENCOUNTER — Encounter
Admission: RE | Disposition: A | Discharge status: Home / Self Care | Payer: Self-pay | Source: Home / Self Care | Attending: Obstetrics and Gynecology

## 2023-07-21 ENCOUNTER — Ambulatory Visit
Admission: RE | Admit: 2023-07-21 | Discharge: 2023-07-21 | Disposition: A | Discharge status: Home / Self Care | Payer: BLUE CROSS/BLUE SHIELD | Attending: Obstetrics and Gynecology | Admitting: Obstetrics and Gynecology

## 2023-07-21 ENCOUNTER — Other Ambulatory Visit: Payer: Self-pay

## 2023-07-21 ENCOUNTER — Other Ambulatory Visit: Payer: Self-pay | Admitting: Obstetrics and Gynecology

## 2023-07-21 ENCOUNTER — Encounter: Payer: Self-pay | Admitting: Obstetrics and Gynecology

## 2023-07-21 DIAGNOSIS — D649 Anemia, unspecified: Secondary | ICD-10-CM | POA: Diagnosis present

## 2023-07-21 DIAGNOSIS — Z8619 Personal history of other infectious and parasitic diseases: Secondary | ICD-10-CM | POA: Insufficient documentation

## 2023-07-21 DIAGNOSIS — G8918 Other acute postprocedural pain: Secondary | ICD-10-CM

## 2023-07-21 DIAGNOSIS — O034 Incomplete spontaneous abortion without complication: Secondary | ICD-10-CM | POA: Diagnosis present

## 2023-07-21 DIAGNOSIS — D62 Acute posthemorrhagic anemia: Secondary | ICD-10-CM | POA: Diagnosis present

## 2023-07-21 HISTORY — PX: DILATION AND EVACUATION: SHX1459

## 2023-07-21 LAB — PREPARE RBC (CROSSMATCH)

## 2023-07-21 SURGERY — DILATION AND EVACUATION, UTERUS
Anesthesia: General | Site: Vagina

## 2023-07-21 MED ORDER — CHLORHEXIDINE GLUCONATE 0.12 % MT SOLN
OROMUCOSAL | Status: AC
  Filled 2023-07-21: qty 15

## 2023-07-21 MED ORDER — 0.9 % SODIUM CHLORIDE (POUR BTL) OPTIME
TOPICAL | Status: DC | PRN
  Administered 2023-07-21: 500 mL

## 2023-07-21 MED ORDER — MIDAZOLAM HCL 2 MG/2ML IJ SOLN
INTRAMUSCULAR | Status: AC
  Filled 2023-07-21: qty 2

## 2023-07-21 MED ORDER — ROCURONIUM BROMIDE 10 MG/ML (PF) SYRINGE
PREFILLED_SYRINGE | INTRAVENOUS | Status: AC
  Filled 2023-07-21: qty 10

## 2023-07-21 MED ORDER — SODIUM CHLORIDE 0.9 % IV SOLN
INTRAVENOUS | Status: DC | PRN
Start: 2023-07-21 — End: 2023-07-21

## 2023-07-21 MED ORDER — HYDROCODONE-ACETAMINOPHEN 5-325 MG PO TABS
1.0000 | ORAL_TABLET | Freq: Four times a day (QID) | ORAL | 0 refills | Status: DC | PRN
Start: 2023-07-21 — End: 2023-07-21

## 2023-07-21 MED ORDER — OXYCODONE HCL 5 MG PO TABS
5.0000 mg | ORAL_TABLET | Freq: Once | ORAL | Status: AC | PRN
  Administered 2023-07-21: 5 mg via ORAL

## 2023-07-21 MED ORDER — SILVER NITRATE-POT NITRATE 75-25 % EX MISC
CUTANEOUS | Status: AC
  Filled 2023-07-21: qty 10

## 2023-07-21 MED ORDER — IBUPROFEN 600 MG PO TABS
600.0000 mg | ORAL_TABLET | Freq: Four times a day (QID) | ORAL | 0 refills | Status: DC
Start: 2023-07-21 — End: 2024-08-03

## 2023-07-21 MED ORDER — SILVER NITRATE-POT NITRATE 75-25 % EX MISC
CUTANEOUS | Status: DC | PRN
  Administered 2023-07-21: 2 via TOPICAL

## 2023-07-21 MED ORDER — FENTANYL CITRATE (PF) 100 MCG/2ML IJ SOLN
INTRAMUSCULAR | Status: DC | PRN
  Administered 2023-07-21: 50 ug via INTRAVENOUS
  Administered 2023-07-21 (×2): 25 ug via INTRAVENOUS

## 2023-07-21 MED ORDER — ONDANSETRON 8 MG PO TBDP: 8.0000 mg | ORAL_TABLET | Freq: Three times a day (TID) | ORAL | 0 refills | Status: AC | PRN

## 2023-07-21 MED ORDER — PROPOFOL 10 MG/ML IV BOLUS
INTRAVENOUS | Status: AC
  Filled 2023-07-21: qty 20

## 2023-07-21 MED ORDER — DEXAMETHASONE SODIUM PHOSPHATE 10 MG/ML IJ SOLN
INTRAMUSCULAR | Status: AC
  Filled 2023-07-21: qty 1

## 2023-07-21 MED ORDER — FENTANYL CITRATE (PF) 100 MCG/2ML IJ SOLN
INTRAMUSCULAR | Status: AC
  Filled 2023-07-21: qty 2

## 2023-07-21 MED ORDER — SODIUM CHLORIDE 0.9% IV SOLUTION: Freq: Once | INTRAVENOUS | Status: DC

## 2023-07-21 MED ORDER — MIDAZOLAM HCL 2 MG/2ML IJ SOLN
INTRAMUSCULAR | Status: DC | PRN
  Administered 2023-07-21: 2 mg via INTRAVENOUS

## 2023-07-21 MED ORDER — DEXAMETHASONE SODIUM PHOSPHATE 10 MG/ML IJ SOLN
INTRAMUSCULAR | Status: DC | PRN
  Administered 2023-07-21: 6 mg via INTRAVENOUS

## 2023-07-21 MED ORDER — ONDANSETRON HCL 4 MG/2ML IJ SOLN
INTRAMUSCULAR | Status: AC
  Filled 2023-07-21: qty 2

## 2023-07-21 MED ORDER — ONDANSETRON HCL 4 MG/2ML IJ SOLN
INTRAMUSCULAR | Status: DC | PRN
  Administered 2023-07-21: 4 mg via INTRAVENOUS

## 2023-07-21 MED ORDER — OXYCODONE HCL 5 MG/5ML PO SOLN: 5.0000 mg | Freq: Once | ORAL | Status: AC | PRN

## 2023-07-21 MED ORDER — FAMOTIDINE 20 MG PO TABS
ORAL_TABLET | ORAL | Status: AC
  Filled 2023-07-21: qty 1

## 2023-07-21 MED ORDER — FENTANYL CITRATE (PF) 100 MCG/2ML IJ SOLN: 25.0000 ug | INTRAMUSCULAR | Status: DC | PRN

## 2023-07-21 MED ORDER — DOXYCYCLINE HYCLATE 100 MG PO CAPS
100.0000 mg | ORAL_CAPSULE | Freq: Two times a day (BID) | ORAL | 0 refills | Status: AC
Start: 2023-07-21 — End: 2023-07-28

## 2023-07-21 MED ORDER — PROPOFOL 1000 MG/100ML IV EMUL
INTRAVENOUS | Status: AC
  Filled 2023-07-21: qty 100

## 2023-07-21 MED ORDER — PROPOFOL 500 MG/50ML IV EMUL
INTRAVENOUS | Status: DC | PRN
  Administered 2023-07-21: 150 ug/kg/min via INTRAVENOUS

## 2023-07-21 MED ORDER — OXYCODONE HCL 5 MG PO TABS
ORAL_TABLET | ORAL | Status: AC
  Filled 2023-07-21: qty 1

## 2023-07-21 SURGICAL SUPPLY — 28 items
BAG DRN RND TRDRP ANRFLXCHMBR (UROLOGICAL SUPPLIES)
BAG URINE DRAIN 2000ML AR STRL (UROLOGICAL SUPPLIES) IMPLANT
CATH FOLEY 2WAY 5CC 16FR (CATHETERS)
CATH URTH 16FR FL 2W BLN LF (CATHETERS) IMPLANT
DRSG TELFA 3X8 NADH STRL (GAUZE/BANDAGES/DRESSINGS) IMPLANT
FILTER UTR ASPR SPEC (MISCELLANEOUS) ×1 IMPLANT
FLTR UTR ASPR SPEC (MISCELLANEOUS) ×1
GLOVE BIO SURGEON STRL SZ7 (GLOVE) ×1 IMPLANT
GOWN STRL REUS W/ TWL LRG LVL3 (GOWN DISPOSABLE) ×2 IMPLANT
GOWN STRL REUS W/TWL LRG LVL3 (GOWN DISPOSABLE) ×2
KIT BERKELEY 1ST TRIMESTER 3/8 (MISCELLANEOUS) ×1 IMPLANT
KIT TURNOVER CYSTO (KITS) ×1 IMPLANT
MANIFOLD NEPTUNE II (INSTRUMENTS) ×1 IMPLANT
NS IRRIG 500ML POUR BTL (IV SOLUTION) ×1 IMPLANT
PACK DNC HYST (MISCELLANEOUS) ×1 IMPLANT
PAD OB MATERNITY 4.3X12.25 (PERSONAL CARE ITEMS) ×1 IMPLANT
PAD PREP OB/GYN DISP 24X41 (PERSONAL CARE ITEMS) ×1 IMPLANT
SCRUB CHG 4% DYNA-HEX 4OZ (MISCELLANEOUS) ×1 IMPLANT
SET BERKELEY SUCTION TUBING (SUCTIONS) ×1 IMPLANT
SET CYSTO W/LG BORE CLAMP LF (SET/KITS/TRAYS/PACK) IMPLANT
TOWEL OR 17X26 4PK STRL BLUE (TOWEL DISPOSABLE) ×1 IMPLANT
TRAP FLUID SMOKE EVACUATOR (MISCELLANEOUS) ×1 IMPLANT
VACURETTE 10 RIGID CVD (CANNULA) IMPLANT
VACURETTE 12 RIGID CVD (CANNULA) IMPLANT
VACURETTE 7MM F TIP STRL (CANNULA) IMPLANT
VACURETTE 8 RIGID CVD (CANNULA) IMPLANT
VACURETTE 8MM F TIP (MISCELLANEOUS) IMPLANT
WATER STERILE IRR 500ML POUR (IV SOLUTION) ×1 IMPLANT

## 2023-07-21 NOTE — Anesthesia Postprocedure Evaluation (Signed)
Anesthesia Post Note  Patient: Elyse S Mcparland  Procedure(s) Performed: DILATATION AND EVACUATION (Vagina )  Patient location during evaluation: PACU Anesthesia Type: General Level of consciousness: awake and alert Pain management: pain level controlled Vital Signs Assessment: post-procedure vital signs reviewed and stable Respiratory status: spontaneous breathing, nonlabored ventilation, respiratory function stable and patient connected to nasal cannula oxygen Cardiovascular status: blood pressure returned to baseline and stable Postop Assessment: no apparent nausea or vomiting Anesthetic complications: no   No notable events documented.   Last Vitals:  Vitals:   07/21/23 1115 07/21/23 1126  BP: 111/82 109/78  Pulse: (!) 58 65  Resp: 15 14  Temp: 36.5 C 36.6 C  SpO2: 100% 100%    Last Pain:  Vitals:   07/21/23 1126  TempSrc: Temporal  PainSc: 1                  Corinda Gubler

## 2023-07-21 NOTE — H&P (Signed)
Preoperative History and Physical  Chief Complaint:  Dana Walker is a 27 y.o. [phone] here for surgical management of incomplete miscarriage.   No significant preoperative concerns.  History of Present Illness: 27 y.o. G84P1021 female who presents in follow up from an ER visit on 06/01/2023 for severe anemia due to incomplete spontaneous abortion.  She elected to try misoprostol to help pass the remaining tissue. She has had three big clots, followed by spotting. Two days ago she had very heavy bleeding ("a lot, a lot"). She started taking norethindrone 5 mg. She took a total of 3 pills. She didn't pass out and was able to eat plenty of food.   Back in July she tried misoprostol and still did not pass all the products of conception. She had required a blood transfusion in the hospital and once since then due to severe anemia. She tried norethindrone for when her bleeding is heavy.   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  Since her last visit with me she stopped bleeding and then this started again on 7/28 and has continued to bleed. She thinks she has had a period in there, as well.   She is interested in an ablation at the time of the [facility].   She has tried hormonal birth control (pill and patch). They all cause nausea and weight gain.   Proposed surgery: suction, dilation and curettage  Past Medical History:  Diagnosis Date   Anemia    Anxiety    Migraines    PONV (postoperative nausea and vomiting)    Past Surgical History:  Procedure Laterality Date   DILATION AND CURETTAGE OF UTERUS     TONSILLECTOMY     OB History  Gravida Para Term Preterm AB Living  4 1 1   3 1   SAB IAB Ectopic Multiple Live Births  3       1    # Outcome Date GA Lbr Len/2nd Weight Sex Type Anes PTL Lv  4 SAB           3 SAB           2 SAB           1 Term      Vag-Spont   LIV  Patient denies any other pertinent gynecologic issues.    No current facility-administered medications on file prior to encounter.   Current Outpatient Medications on File Prior to Encounter  Medication Sig Dispense Refill   ferrous sulfate 325 (65 FE) MG tablet Take 1 tablet (325 mg total) by mouth daily. 30 tablet 3   hydrOXYzine (ATARAX) 25 MG tablet Take 25 mg by mouth 3 (three) times daily as needed for anxiety.     ondansetron (ZOFRAN-ODT) 4 MG disintegrating tablet Take 1 tablet (4 mg total) by mouth every 6 (six) hours as needed for nausea or vomiting. 20 tablet 0   rizatriptan (MAXALT) 5 MG tablet Take 5 mg by mouth as needed for migraine. May repeat in 2 hours if needed (Patient not taking: Reported on 07/21/2023)     Allergies  Allergen Reactions   Ferrous Gluconate Anaphylaxis    Developed chest tightness, dyspnea, urticaria. Given dose of IM epi.   Penicillins Diarrhea and Hives   Ceclor [Cefaclor] Nausea Only   Latex Rash    Social History:   reports that she has never smoked. She has never used smokeless tobacco.  She reports current alcohol use. She reports that she does not currently use drugs.  Family History  Problem Relation Age of Onset   Diabetes Mother    Healthy Father     Review of Systems: Noncontributory  PHYSICAL EXAM: Blood pressure 106/72, pulse 82, temperature 98.3 F (36.8 C), temperature source Temporal, resp. rate 16, height 5\' 6"  (1.676 m), weight 58.1 kg, last menstrual period 03/25/2023, SpO2 100%. CONSTITUTIONAL: Well-developed, well-nourished female in no acute distress.  HENT:  Normocephalic, atraumatic, External right and left ear normal. Oropharynx is clear and moist EYES: Conjunctivae and EOM are normal. Pupils are equal, round, and reactive to light. No scleral icterus.  NECK: Normal range of motion, supple, no masses SKIN: Skin is warm and dry. No rash noted. Not diaphoretic. No erythema. No pallor. NEUROLGIC: Alert and oriented to person, place, and time. Normal reflexes, muscle tone  coordination. No cranial nerve deficit noted. PSYCHIATRIC: Normal mood and affect. Normal behavior. Normal judgment and thought content. CARDIOVASCULAR: Normal heart rate noted, regular rhythm RESPIRATORY: Effort and breath sounds normal, no problems with respiration noted ABDOMEN: Soft, nontender, nondistended. PELVIC: Deferred MUSCULOSKELETAL: Normal range of motion. No edema and no tenderness. 2+ distal pulses.  Labs: Results for orders placed or performed during the hospital encounter of 07/21/23 (from the past 336 hour(s))  Prepare RBC (crossmatch)   Collection Time: 07/21/23  7:19 AM  Result Value Ref Range   Order Confirmation      ORDER PROCESSED BY BLOOD BANK Performed at [facility], [address] [place], [place] [address]   Results for orders placed or performed during the hospital encounter of 07/20/23 (from the past 336 hour(s))  CBC   Collection Time: 07/20/23  2:09 PM  Result Value Ref Range   WBC 4.8 4.0 - 10.5 K/uL   RBC 3.64 (L) 3.87 - 5.11 MIL/uL   Hemoglobin 6.7 (L) 12.0 - 15.0 g/dL   HCT 60.4 (L) 54.0 - 98.1 %   MCV 67.3 (L) 80.0 - 100.0 fL   MCH 18.4 (L) 26.0 - 34.0 pg   MCHC 27.3 (L) 30.0 - 36.0 g/dL   RDW 19.1 (H) 47.8 - 29.5 %   Platelets 340 150 - 400 K/uL   nRBC 0.0 0.0 - 0.2 %  Basic metabolic panel   Collection Time: 07/20/23  2:09 PM  Result Value Ref Range   Sodium 140 135 - 145 mmol/L   Potassium 3.5 3.5 - 5.1 mmol/L   Chloride 111 98 - 111 mmol/L   CO2 21 (L) 22 - 32 mmol/L   Glucose, Bld 83 70 - 99 mg/dL   BUN 7 6 - 20 mg/dL   Creatinine, Ser 6.21 0.44 - 1.00 mg/dL   Calcium 8.9 8.9 - 30.8 mg/dL   GFR, Estimated >65 >78 mL/min   Anion gap 8 5 - 15  Type and screen [facility]   Collection Time: 07/20/23  2:09 PM  Result Value Ref Range   ABO/RH(D) O POS    Antibody Screen NEG    Sample Expiration 07/23/2023,2359    Extend sample reason      TRANSFUSED IN PAST 3 MONTHS, UNABLE TO EXTEND PREGNANT  WITHIN 3 MONTHS, UNABLE TO EXTEND   Unit Number [id number]    Blood Component Type RED CELLS,LR    Unit division 00    Status of Unit ALLOCATED    Transfusion Status OK TO TRANSFUSE    Crossmatch Result Compatible    Unit Number [id number]  Blood Component Type RED CELLS,LR    Unit division 00    Status of Unit ALLOCATED    Transfusion Status OK TO TRANSFUSE    Crossmatch Result      Compatible Performed at [facility], [address] [place], [place] [address]    Unit Number [id number]    Blood Component Type RED CELLS,LR    Unit division 00    Status of Unit ALLOCATED    Transfusion Status OK TO TRANSFUSE    Crossmatch Result Compatible   BPAM RBC   Collection Time: 07/20/23  2:09 PM  Result Value Ref Range   Blood Product Unit Number [id number]    PRODUCT CODE O8416S06    Unit Type and Rh 9500    Blood Product Expiration Date 301601093235    Blood Product Unit Number [id number]    PRODUCT CODE C3762G31    Unit Type and Rh 5100    Blood Product Expiration Date 517616073710    Blood Product Unit Number [id number]    PRODUCT CODE J0093G18    Unit Type and Rh 5100    Blood Product Expiration Date 299371696789   Results for orders placed or performed during the hospital encounter of 07/20/23 (from the past 336 hour(s))  Prepare RBC (crossmatch)   Collection Time: 07/20/23  5:06 PM  Result Value Ref Range   Order Confirmation      DUPLICATE REQUEST Performed at [facility], [address], [place], [place] [address]     Imaging Studies: No results found.  Assessment: Incomplete abortion  Plan: Patient will undergo surgical management with the above-noted procedure.   The risks of surgery were discussed in detail with the patient including but not limited to: bleeding which may require transfusion or reoperation; infection which may require antibiotics; injury to surrounding organs which may involve bowel, bladder, ureters ;  need for additional procedures including laparoscopy or laparotomy; thromboembolic phenomenon, surgical site problems and other postoperative/anesthesia complications. Likelihood of success in alleviating the patient's condition was discussed. Routine postoperative instructions will be reviewed with the patient and her family in detail after surgery.  The patient concurred with the proposed plan, giving informed written consent for the surgery.  Patient has been NPO since last night she will remain NPO for procedure.  Anesthesia and OR aware.  Preoperative prophylactic antibiotics, as indicated, and SCDs ordered on call to the OR.  To OR when ready.  Her hemoglobin is 6.7. After discussion with patient, will go ahead and give 1 unit pRBCs prior to surgery. Additional units as needed for surgical blood loss. I have personally discussed the risks and benefits of a blood transfusion and my recommendation to receive a transfusion. She is willing to accept the transfusion and has signed the consent.   Thomasene Mohair, MD 07/21/2023 7:37 AM

## 2023-07-21 NOTE — Discharge Instructions (Signed)
AMBULATORY SURGERY  DISCHARGE INSTRUCTIONS   The drugs that you were given will stay in your system until tomorrow so for the next 24 hours you should not:  Drive an automobile Make any legal decisions Drink any alcoholic beverage   You may resume regular meals tomorrow.  Today it is better to start with liquids and gradually work up to solid foods.  You may eat anything you prefer, but it is better to start with liquids, then soup and crackers, and gradually work up to solid foods.   Please notify your doctor immediately if you have any unusual bleeding, trouble breathing, redness and pain at the surgery site, drainage, fever, or pain not relieved by medication.    Additional Instructions: 

## 2023-07-21 NOTE — Op Note (Signed)
  Operative Note    Name: Dana Walker  Date of Service: 07/21/2023  DOB: 1996-08-08  MRN: [id number]   Pre-Operative Diagnosis:  1) Incomplete spontaneous abortion 2) severe anemia 3) acute on chronic blood loss anemia  Post-Operative Diagnosis:  1) Incomplete spontaneous abortion 2) severe anemia 3) acute on chronic blood loss anemia  Procedures: Suction, dilation and curettage  Primary Surgeon: Thomasene Mohair, MD   EBL: 30 mL   IVF: 450 mL (this includes crystalloid and IV antibiotics)  Urine output: 100 mL  Specimens: products of conception  Drains: none  Complications: None   Disposition: PACU   Condition: Stable   Findings:  1) Slight enlarged anteverted uterus 2) small amount of retained products of conception  Procedure Summary:  After informed consent was confirmed, the patient was taken to the operating room where general anesthesia was induced.  Her legs were carefully placed in the [place] stirrups.  She was prepped and draped in the standard fashion.  A speculum was placed in the vagina.   A tenaculum was placed on the anterior lip of the cervix.  The cervix was serially dilated to 8 mm.  The 8 mm flexible suction curette was advanced to the uterine fundus.  Two passes were made with the suction curette with evacuation of adequate tissue noted.  The 8 mm rigid suction curette was then used to try to get more adherent tissue.  Two passes were made using sharp curettage until a gritty texture was noted (both a dull, and serrated curette were used).  One final pass was made with the suction curette with minimal return.    All instruments were removed from the vagina and the cervix was noted to be hemostatic after removal of the tenaculum and application of silver nitrate was performed at the tenaculum entry sites. The vagina was clear of all laps and sponges and the speculum was removed.   The patient tolerated the procedure well.  Sponge, lap, needle, and  instrument counts were correct x 2.  VTE prophylaxis: SCDs. Antibiotic prophylaxis: doxycycline 200 mg IV. She was given one unit of pRBCs in the pre-operative area for a hemoglobin of 6.7.  She was awakened in the operating room and was taken to the PACU in stable condition.   Thomasene Mohair, MD 07/21/2023 10:24 AM

## 2023-07-21 NOTE — Progress Notes (Signed)
error 

## 2023-07-21 NOTE — Transfer of Care (Signed)
Immediate Anesthesia Transfer of Care Note  Patient: Dana Walker  Procedure(s) Performed: DILATATION AND EVACUATION (Vagina )  Patient Location: PACU  Anesthesia Type:MAC  Level of Consciousness: awake, oriented, and patient cooperative  Airway & Oxygen Therapy: Patient Spontanous Breathing  Post-op Assessment: Report given to RN and Post -op Vital signs reviewed and stable  Post vital signs: Reviewed and stable  Last Vitals:  Vitals Value Taken Time  BP 106/68 07/21/23 1040  Temp    Pulse 71 07/21/23 1042  Resp 13 07/21/23 1042  SpO2 100 % 07/21/23 1042  Vitals shown include unfiled device data.  Last Pain:  Vitals:   07/21/23 0748  TempSrc: Oral  PainSc:          Complications: No notable events documented.

## 2023-07-21 NOTE — Anesthesia Preprocedure Evaluation (Signed)
Anesthesia Evaluation  Patient identified by MRN, date of birth, ID band Patient awake    Reviewed: Allergy & Precautions, NPO status , Patient's Chart, lab work & pertinent test results  History of Anesthesia Complications (+) PONV and history of anesthetic complications  Airway Mallampati: II  TM Distance: >3 FB Neck ROM: full    Dental  (+) Chipped   Pulmonary neg pulmonary ROS   Pulmonary exam normal        Cardiovascular Exercise Tolerance: Good (-) angina (-) Past MI negative cardio ROS Normal cardiovascular exam     Neuro/Psych  Headaches PSYCHIATRIC DISORDERS         GI/Hepatic negative GI ROS, Neg liver ROS,,,  Endo/Other  negative endocrine ROS    Renal/GU      Musculoskeletal   Abdominal   Peds  Hematology  (+) Blood dyscrasia, anemia   Anesthesia Other Findings Past Medical History: No date: Anemia No date: Anxiety No date: Migraines No date: PONV (postoperative nausea and vomiting)  Past Surgical History: No date: DILATION AND CURETTAGE OF UTERUS No date: TONSILLECTOMY  BMI    Body Mass Index: 20.66 kg/m      Reproductive/Obstetrics negative OB ROS                             Anesthesia Physical Anesthesia Plan  ASA: 3 and emergent  Anesthesia Plan: General ETT   Post-op Pain Management:    Induction: Intravenous  PONV Risk Score and Plan: Ondansetron, Dexamethasone, Midazolam and Treatment may vary due to age or medical condition  Airway Management Planned: Oral ETT  Additional Equipment:   Intra-op Plan:   Post-operative Plan: Extubation in OR  Informed Consent: I have reviewed the patients History and Physical, chart, labs and discussed the procedure including the risks, benefits and alternatives for the proposed anesthesia with the patient or authorized representative who has indicated his/her understanding and acceptance.     Dental Advisory  Given  Plan Discussed with: Anesthesiologist, CRNA and Surgeon  Anesthesia Plan Comments: (Patient has multiple facial and oral piercing's. She reports that she may not be able to remove all of them. Consented for risk of injury if any are not removed. She voiced assent.   Patient consented for risks of anesthesia including but not limited to:  - adverse reactions to medications - damage to eyes, teeth, lips or other oral mucosa - nerve damage due to positioning  - sore throat or hoarseness - Damage to heart, brain, nerves, lungs, other parts of body or loss of life  Patient voiced understanding.)       Anesthesia Quick Evaluation

## 2024-08-03 ENCOUNTER — Inpatient Hospital Stay
Admission: EM | Admit: 2024-08-03 | Discharge: 2024-08-05 | DRG: 558 | Disposition: A | Discharge status: Home / Self Care | Payer: PRIVATE HEALTH INSURANCE | Attending: Internal Medicine | Admitting: Internal Medicine

## 2024-08-03 ENCOUNTER — Ambulatory Visit (INDEPENDENT_AMBULATORY_CARE_PROVIDER_SITE_OTHER)
Admission: EM | Admit: 2024-08-03 | Discharge: 2024-08-03 | Disposition: A | Discharge status: Home / Self Care | Payer: PRIVATE HEALTH INSURANCE | Source: Home / Self Care | Attending: Family Medicine | Admitting: Family Medicine

## 2024-08-03 ENCOUNTER — Encounter: Payer: Self-pay | Admitting: Emergency Medicine

## 2024-08-03 ENCOUNTER — Other Ambulatory Visit: Payer: Self-pay

## 2024-08-03 ENCOUNTER — Ambulatory Visit: Payer: Self-pay | Admitting: Family Medicine

## 2024-08-03 DIAGNOSIS — Z881 Allergy status to other antibiotic agents status: Secondary | ICD-10-CM | POA: Diagnosis not present

## 2024-08-03 DIAGNOSIS — M79652 Pain in left thigh: Secondary | ICD-10-CM | POA: Diagnosis not present

## 2024-08-03 DIAGNOSIS — Z880 Allergy status to penicillin: Secondary | ICD-10-CM

## 2024-08-03 DIAGNOSIS — G43909 Migraine, unspecified, not intractable, without status migrainosus: Secondary | ICD-10-CM | POA: Diagnosis present

## 2024-08-03 DIAGNOSIS — M6282 Rhabdomyolysis: Secondary | ICD-10-CM

## 2024-08-03 DIAGNOSIS — Z91040 Latex allergy status: Secondary | ICD-10-CM

## 2024-08-03 DIAGNOSIS — Z833 Family history of diabetes mellitus: Secondary | ICD-10-CM

## 2024-08-03 DIAGNOSIS — Z79899 Other long term (current) drug therapy: Secondary | ICD-10-CM | POA: Diagnosis not present

## 2024-08-03 DIAGNOSIS — Z832 Family history of diseases of the blood and blood-forming organs and certain disorders involving the immune mechanism: Secondary | ICD-10-CM

## 2024-08-03 DIAGNOSIS — D509 Iron deficiency anemia, unspecified: Secondary | ICD-10-CM | POA: Diagnosis present

## 2024-08-03 DIAGNOSIS — M79651 Pain in right thigh: Secondary | ICD-10-CM

## 2024-08-03 DIAGNOSIS — Z862 Personal history of diseases of the blood and blood-forming organs and certain disorders involving the immune mechanism: Secondary | ICD-10-CM

## 2024-08-03 DIAGNOSIS — F411 Generalized anxiety disorder: Secondary | ICD-10-CM | POA: Diagnosis present

## 2024-08-03 LAB — COMPREHENSIVE METABOLIC PANEL WITH GFR
ALT: 39 U/L (ref 0–44)
ALT: 47 U/L — ABNORMAL HIGH (ref 0–44)
AST: 138 U/L — ABNORMAL HIGH (ref 15–41)
AST: 156 U/L — ABNORMAL HIGH (ref 15–41)
Albumin: 3.8 g/dL (ref 3.5–5.0)
Albumin: 3.8 g/dL (ref 3.5–5.0)
Alkaline Phosphatase: 54 U/L (ref 38–126)
Alkaline Phosphatase: 49 U/L (ref 38–126)
Anion gap: 9 (ref 5–15)
Anion gap: 6 (ref 5–15)
BUN: 9 mg/dL (ref 6–20)
BUN: 11 mg/dL (ref 6–20)
CO2: 24 mmol/L (ref 22–32)
CO2: 25 mmol/L (ref 22–32)
Calcium: 9.2 mg/dL (ref 8.9–10.3)
Calcium: 9.4 mg/dL (ref 8.9–10.3)
Chloride: 103 mmol/L (ref 98–111)
Chloride: 109 mmol/L (ref 98–111)
Creatinine, Ser: 0.79 mg/dL (ref 0.44–1.00)
Creatinine, Ser: 0.74 mg/dL (ref 0.44–1.00)
GFR, Estimated: >60 mL/min (ref >60)
GFR, Estimated: >60 mL/min (ref >60)
Glucose, Bld: 87 mg/dL (ref 70–99)
Glucose, Bld: 101 mg/dL — ABNORMAL HIGH (ref 70–99)
Potassium: 3.8 mmol/L (ref 3.5–5.1)
Potassium: 3.6 mmol/L (ref 3.5–5.1)
Sodium: 136 mmol/L (ref 135–145)
Sodium: 140 mmol/L (ref 135–145)
Total Bilirubin: 0.4 mg/dL (ref 0.0–1.2)
Total Bilirubin: 0.5 mg/dL (ref 0.0–1.2)
Total Protein: 7.1 g/dL (ref 6.5–8.1)
Total Protein: 6.8 g/dL (ref 6.5–8.1)

## 2024-08-03 LAB — CBC WITH DIFFERENTIAL/PLATELET
Abs Immature Granulocytes: 0.01 K/uL (ref 0.00–0.07)
Abs Immature Granulocytes: 0.01 K/uL (ref 0.00–0.07)
Basophils Absolute: 0 K/uL (ref 0.0–0.1)
Basophils Absolute: 0 K/uL (ref 0.0–0.1)
Basophils Relative: 1 %
Basophils Relative: 1 %
Eosinophils Absolute: 0.1 K/uL (ref 0.0–0.5)
Eosinophils Absolute: 0.2 K/uL (ref 0.0–0.5)
Eosinophils Relative: 3 %
Eosinophils Relative: 4 %
HCT: 36.9 % (ref 36.0–46.0)
HCT: 36.4 % (ref 36.0–46.0)
Hemoglobin: 11.4 g/dL — ABNORMAL LOW (ref 12.0–15.0)
Hemoglobin: 11 g/dL — ABNORMAL LOW (ref 12.0–15.0)
Immature Granulocytes: 0 %
Immature Granulocytes: 0 %
Lymphocytes Relative: 36 %
Lymphocytes Relative: 35 %
Lymphs Abs: 1.4 K/uL (ref 0.7–4.0)
Lymphs Abs: 1.5 K/uL (ref 0.7–4.0)
MCH: 22 pg — ABNORMAL LOW (ref 26.0–34.0)
MCH: 22 pg — ABNORMAL LOW (ref 26.0–34.0)
MCHC: 30.9 g/dL (ref 30.0–36.0)
MCHC: 30.2 g/dL (ref 30.0–36.0)
MCV: 71.2 fL — ABNORMAL LOW (ref 80.0–100.0)
MCV: 72.9 fL — ABNORMAL LOW (ref 80.0–100.0)
Monocytes Absolute: 0.3 K/uL (ref 0.1–1.0)
Monocytes Absolute: 0.3 K/uL (ref 0.1–1.0)
Monocytes Relative: 7 %
Monocytes Relative: 7 %
Neutro Abs: 2.2 K/uL (ref 1.7–7.7)
Neutro Abs: 2.3 K/uL (ref 1.7–7.7)
Neutrophils Relative %: 53 %
Neutrophils Relative %: 53 %
Platelets: 220 K/uL (ref 150–400)
Platelets: 233 K/uL (ref 150–400)
RBC: 5.18 MIL/uL — ABNORMAL HIGH (ref 3.87–5.11)
RBC: 4.99 MIL/uL (ref 3.87–5.11)
RDW: 17.7 % — ABNORMAL HIGH (ref 11.5–15.5)
RDW: 16.9 % — ABNORMAL HIGH (ref 11.5–15.5)
WBC: 4 K/uL (ref 4.0–10.5)
WBC: 4.3 K/uL (ref 4.0–10.5)
nRBC: 0 % (ref 0.0–0.2)
nRBC: 0 % (ref 0.0–0.2)

## 2024-08-03 LAB — URINALYSIS, ROUTINE W REFLEX MICROSCOPIC
Bilirubin Urine: NEGATIVE
Glucose, UA: NEGATIVE mg/dL
Hgb urine dipstick: NEGATIVE
Ketones, ur: NEGATIVE mg/dL
Nitrite: NEGATIVE
Protein, ur: NEGATIVE mg/dL
Specific Gravity, Urine: 1.02 (ref 1.005–1.030)
pH: 6 (ref 5.0–8.0)

## 2024-08-03 LAB — HCG, QUANTITATIVE, PREGNANCY: hCG, Beta Chain, Quant, S: <1 m[IU]/mL (ref <5)

## 2024-08-03 LAB — CK
Total CK: 6296 U/L — ABNORMAL HIGH (ref 38–234)
Total CK: 7644 U/L — ABNORMAL HIGH (ref 38–234)

## 2024-08-03 MED ORDER — ACETAMINOPHEN 650 MG RE SUPP: 650.0000 mg | Freq: Four times a day (QID) | RECTAL | Status: DC | PRN

## 2024-08-03 MED ORDER — FERROUS SULFATE 325 (65 FE) MG PO TABS
325.0000 mg | ORAL_TABLET | Freq: Every day | ORAL | Status: DC
  Administered 2024-08-04 – 2024-08-05 (×2): 325 mg via ORAL
  Filled 2024-08-03 (×2): qty 1

## 2024-08-03 MED ORDER — HYDROXYZINE HCL 50 MG PO TABS
25.0000 mg | ORAL_TABLET | Freq: Three times a day (TID) | ORAL | Status: DC | PRN
  Administered 2024-08-03 – 2024-08-04 (×2): 25 mg via ORAL
  Filled 2024-08-03 (×2): qty 1

## 2024-08-03 MED ORDER — NAPROXEN 500 MG PO TABS: 500.0000 mg | ORAL_TABLET | Freq: Two times a day (BID) | ORAL | 0 refills | Status: DC

## 2024-08-03 MED ORDER — MAGNESIUM HYDROXIDE 400 MG/5ML PO SUSP: 30.0000 mL | Freq: Every day | ORAL | Status: DC | PRN

## 2024-08-03 MED ORDER — ACETAMINOPHEN 325 MG PO TABS: 650.0000 mg | ORAL_TABLET | Freq: Four times a day (QID) | ORAL | Status: DC | PRN

## 2024-08-03 MED ORDER — TRAZODONE HCL 50 MG PO TABS
25.0000 mg | ORAL_TABLET | Freq: Every evening | ORAL | Status: DC | PRN
  Administered 2024-08-04: 25 mg via ORAL
  Filled 2024-08-03: qty 1

## 2024-08-03 MED ORDER — ENOXAPARIN SODIUM 40 MG/0.4ML IJ SOSY
40.0000 mg | PREFILLED_SYRINGE | INTRAMUSCULAR | Status: DC
  Administered 2024-08-04 – 2024-08-05 (×2): 40 mg via SUBCUTANEOUS
  Filled 2024-08-03 (×2): qty 0.4

## 2024-08-03 MED ORDER — MORPHINE SULFATE (PF) 2 MG/ML IV SOLN
2.0000 mg | INTRAVENOUS | Status: DC | PRN
  Administered 2024-08-03 – 2024-08-04 (×2): 2 mg via INTRAVENOUS
  Filled 2024-08-03 (×2): qty 1

## 2024-08-03 MED ORDER — KETOROLAC TROMETHAMINE 15 MG/ML IJ SOLN
15.0000 mg | Freq: Four times a day (QID) | INTRAMUSCULAR | Status: DC | PRN
  Administered 2024-08-04 – 2024-08-05 (×3): 15 mg via INTRAVENOUS
  Filled 2024-08-03 (×3): qty 1

## 2024-08-03 MED ORDER — SODIUM CHLORIDE 0.9 % IV BOLUS
1000.0000 mL | Freq: Once | INTRAVENOUS | Status: AC
  Administered 2024-08-03: 1000 mL via INTRAVENOUS

## 2024-08-03 MED ORDER — ONDANSETRON HCL 4 MG PO TABS: 4.0000 mg | ORAL_TABLET | Freq: Four times a day (QID) | ORAL | Status: DC | PRN

## 2024-08-03 MED ORDER — METHOCARBAMOL 500 MG PO TABS
500.0000 mg | ORAL_TABLET | Freq: Two times a day (BID) | ORAL | Status: DC
  Administered 2024-08-03 – 2024-08-05 (×4): 500 mg via ORAL
  Filled 2024-08-03 (×4): qty 1

## 2024-08-03 MED ORDER — ONDANSETRON HCL 4 MG/2ML IJ SOLN: 4.0000 mg | Freq: Four times a day (QID) | INTRAMUSCULAR | Status: DC | PRN

## 2024-08-03 MED ORDER — SODIUM CHLORIDE 0.9 % IV SOLN: INTRAVENOUS | Status: DC

## 2024-08-03 MED ORDER — METHOCARBAMOL 500 MG PO TABS: 500.0000 mg | ORAL_TABLET | Freq: Two times a day (BID) | ORAL | 0 refills | Status: AC

## 2024-08-03 NOTE — Discharge Instructions (Addendum)
 It is important that you drink a adequate amount of fluids.  Avoid strenuous physical activity for now.  Take the muscle relaxer at bedtime and then also as needed throughout the day.  Take 1000 mg of Tylenol  with Naprosyn  twice a day.   You have anemia but it is improving. Your hemoglobin was 11.4 (nearly normal).   Continue taking your iron supplements.  Your kidney function and electrolytes are normal.  One of your liver enzymes, called AST is mildly elevated.  We will monitor this for now.  It may be a good idea to have this rechecked in the next 2 weeks with your PCP.   Your CK (creatine kinase) level will return later today.  If significantly abnormal, I will call you. You can see your results in MyChart.

## 2024-08-03 NOTE — ED Triage Notes (Signed)
 Patient to ED via POV for abnormal labs- CK 6300. C/o of bilateral leg pain x3 days. NAD noted.

## 2024-08-03 NOTE — Assessment & Plan Note (Addendum)
 CK with some improvement to 5391. Continue to have bilateral leg pain. - Continue with IV fluid -Monitor CK

## 2024-08-03 NOTE — ED Triage Notes (Signed)
 Pt c/o bilateral thigh pain x3 days. States [place] miles at the park then started to cramp from calves up to thighs. But pain mainly in thighs now. Has tried tylenol  w/o relief.

## 2024-08-03 NOTE — Assessment & Plan Note (Signed)
 -  Continue ferrous sulfate

## 2024-08-03 NOTE — H&P (Signed)
 [place]   PATIENT NAME: Dana Walker    MR#:  [id number]  DATE OF BIRTH:  09-18-1996  DATE OF ADMISSION:  08/03/2024  PRIMARY CARE PHYSICIAN: Quin Damien Maier, PA   Patient is coming from: Home  REQUESTING/REFERRING PHYSICIAN: Nicholaus Maize, MD  CHIEF COMPLAINT:   Chief Complaint  Patient presents with   Abnormal Lab    HISTORY OF PRESENT ILLNESS:  Dana Walker is a 28 y.o.  African-American female with medical history significant for anxiety, anemia, and migraine, who presented to the emergency room with acute onset of bilateral thigh pain.  She walked 2 miles today which is more than her usual.  She denies any nausea or vomiting or diarrhea or abdominal pain.  She has been having flatulence.  On Saturday she had leg cramps as well as thigh cramps.  She denied any fever or chills.  No chest pain or palpitations.  No cough or wheezing or hemoptysis.  ED Course: In the ER, vital signs were within normal normal and labs revealed AST 156 and ALT of 47 above cardiolipins this morning.  CK was 76.4 up from 60-96 earlier today.  Hemoglobin was 11 hematocrit 36.4.  CBC was normal.  UA showed 6-10 WBCs and rare bacteria with moderate leukocytes. EKG as reviewed by me :  EKG showed normal sinus rhythm with a rate of 69 with sinus arrhythmia. Imaging: None.  The patient was given 2 L bolus of IV normal saline.  She will be admitted to a medical telemetry bed for further evaluation and management. PAST MEDICAL HISTORY:   Past Medical History:  Diagnosis Date   Anemia    Anxiety    Migraines    PONV (postoperative nausea and vomiting)     PAST SURGICAL HISTORY:   Past Surgical History:  Procedure Laterality Date   DILATION AND CURETTAGE OF UTERUS     DILATION AND EVACUATION N/A 07/21/2023   Procedure: DILATATION AND EVACUATION;  Surgeon: Leonce Garnette BIRCH, MD;  Location: [facility] ORS;  Service: Gynecology;  Laterality: N/A;   TONSILLECTOMY      SOCIAL HISTORY:    Social History   Tobacco Use   Smoking status: Never   Smokeless tobacco: Never  Substance Use Topics   Alcohol use: Yes    FAMILY HISTORY:   Family History  Problem Relation Age of Onset   Diabetes Mother    Healthy Father     DRUG ALLERGIES:   Allergies  Allergen Reactions   Ferrous Gluconate Anaphylaxis    Developed chest tightness, dyspnea, urticaria. Given dose of IM epi.   Penicillins Diarrhea and Hives   Ceclor [Cefaclor] Nausea Only   Kiwi Extract Rash   Latex Rash    REVIEW OF SYSTEMS:   ROS As per history of present illness. All pertinent systems were reviewed above. Constitutional, HEENT, cardiovascular, respiratory, GI, GU, musculoskeletal, neuro, psychiatric, endocrine, integumentary and hematologic systems were reviewed and are otherwise negative/unremarkable except for positive findings mentioned above in the HPI.   MEDICATIONS AT HOME:   Prior to Admission medications   Medication Sig Start Date End Date Taking? Authorizing Provider  methocarbamol  (ROBAXIN ) 500 MG tablet Take 1 tablet (500 mg total) by mouth 2 (two) times daily. 08/03/24  Yes Brimage, Vondra, DO  naproxen  (NAPROSYN ) 500 MG tablet Take 1 tablet (500 mg total) by mouth 2 (two) times daily with a meal. 08/03/24  Yes Brimage, Vondra, DO  ondansetron  (ZOFRAN -ODT) 8 MG disintegrating tablet Take  1 tablet (8 mg total) by mouth every 8 (eight) hours as needed for nausea or vomiting. 07/21/23  Yes Leonce Garnette BIRCH, MD  ferrous sulfate  325 (65 FE) MG tablet Take 1 tablet (325 mg total) by mouth daily. 11/23/22 11/23/23  Ward, Josette SAILOR, DO  hydrOXYzine  (ATARAX ) 25 MG tablet Take 25 mg by mouth 3 (three) times daily as needed for anxiety.    [provider]      VITAL SIGNS:  Blood pressure 110/70, pulse 64, temperature 98.9 F (37.2 C), temperature source Oral, resp. rate 18, height 5' 6 (1.676 m), weight 60 kg, last menstrual period 07/25/2024, SpO2 100%.  PHYSICAL EXAMINATION:   Physical Exam  GENERAL:  29 y.o.-year-old patient lying in the bed with no acute distress.  EYES: Pupils equal, round, reactive to light and accommodation. No scleral icterus. Extraocular muscles intact.  HEENT: Head atraumatic, normocephalic. Oropharynx and nasopharynx clear.  NECK:  Supple, no jugular venous distention. No thyroid enlargement, no tenderness.  LUNGS: Normal breath sounds bilaterally, no wheezing, rales,rhonchi or crepitation. No use of accessory muscles of respiration.  CARDIOVASCULAR: Regular rate and rhythm, S1, S2 normal. No murmurs, rubs, or gallops.  ABDOMEN: Soft, nondistended, nontender. Bowel sounds present. No organomegaly or mass.  EXTREMITIES: No pedal edema, cyanosis, or clubbing.  NEUROLOGIC: Cranial nerves II through XII are intact. Muscle strength 5/5 in all extremities. Sensation intact. Gait not checked.  PSYCHIATRIC: The patient is alert and oriented x 3.  Normal affect and good eye contact. SKIN: No obvious rash, lesion, or ulcer.   LABORATORY PANEL:   CBC Recent Labs  Lab 08/03/24 1845  WBC 4.3  HGB 11.0*  HCT 36.4  PLT 233   ------------------------------------------------------------------------------------------------------------------  Chemistries  Recent Labs  Lab 08/03/24 1845  NA 140  K 3.6  CL 109  CO2 25  GLUCOSE 101*  BUN 11  CREATININE 0.74  CALCIUM 9.4  AST 156*  ALT 47*  ALKPHOS 49  BILITOT 0.5   ------------------------------------------------------------------------------------------------------------------  Cardiac Enzymes No results for input(s): TROPONINI in the last 168 hours. ------------------------------------------------------------------------------------------------------------------  RADIOLOGY:  No results found.    IMPRESSION AND PLAN:  Assessment and Plan: * Rhabdomyolysis - The patient will be admitted to a medical telemetry bed. - She was aggressively hydrated with IV normal saline. -  Will follow serial CK levels. - Pain management will be provided. - Muscle relaxants will be utilized as needed.  History of anemia - Continue ferrous sulfate .    DVT prophylaxis: Lovenox . Advanced Care Planning:  Code Status: full code. Family Communication:  The plan of care was discussed in details with the patient (and family). I answered all questions. The patient agreed to proceed with the above mentioned plan. Further management will depend upon hospital course. Disposition Plan: Back to previous home environment Consults called: none. All the records are reviewed and case discussed with ED provider.  Status is: Inpatient  At the time of the admission, it appears that the appropriate admission status for this patient is inpatient.  This is judged to be reasonable and necessary in order to provide the required intensity of service to ensure the patient's safety given the presenting symptoms, physical exam findings and initial radiographic and laboratory data in the context of comorbid conditions.  The patient requires inpatient status due to high intensity of service, high risk of further deterioration and high frequency of surveillance required.  I certify that at the time of admission, it is my clinical judgment that the patient  will require inpatient hospital care extending more than 2 midnights.                            Dispo: The patient is from: Home              Anticipated d/c is to: Home              Patient currently is not medically stable to d/c.              Difficult to place patient: No  Madison DELENA Peaches M.D on 08/03/2024 at 11:49 PM  [facility]   From 7 PM-7 AM, contact night-coverage [email or web address]  CC: Primary care physician; Quin Damien Maier, PA

## 2024-08-03 NOTE — ED Provider Notes (Signed)
 [facility]    CSN: [id number] Arrival date & time: 08/03/24  0818      History   Chief Complaint Chief Complaint  Patient presents with   Thigh Pain    HPI  HPI Dana Walker is a 28 y.o. female.   Dana Walker presents for bilateral thigh pain for the past 3 days. Had severe cramping in legs and thighs on Saturday.  She drinks a lot of water, replenishing electrolytes and mustard without relief. She sits a lot at work.  She walked 2 miles after walking in the park with her brother. She tried epsom salts, magnesium  spray and Tylenol , Advil . Pain is getting worse.   She has an paternal aunt with Lupus and others have sickle cell.  Had a miscarriage last year.    Past Medical History:  Diagnosis Date   Anemia    Anxiety    Migraines    PONV (postoperative nausea and vomiting)     Patient Active Problem List   Diagnosis Date Noted   Acute on chronic blood loss anemia 06/02/2023   Incomplete spontaneous abortion 06/02/2023   Severe anemia 06/02/2023   GAD (generalized anxiety disorder) 12/30/2021   Iron deficiency anemia 02/09/2019   Family history of diabetes mellitus in first degree relative 04/25/2014    Past Surgical History:  Procedure Laterality Date   DILATION AND CURETTAGE OF UTERUS     DILATION AND EVACUATION N/A 07/21/2023   Procedure: DILATATION AND EVACUATION;  Surgeon: Leonce Garnette BIRCH, MD;  Location: [facility] ORS;  Service: Gynecology;  Laterality: N/A;   TONSILLECTOMY      OB History     Gravida  4   Para  1   Term  1   Preterm      AB  3   Living  1      SAB  3   IAB      Ectopic      Multiple      Live Births  1            Home Medications    Prior to Admission medications   Medication Sig Start Date End Date Taking? Authorizing Provider  methocarbamol  (ROBAXIN ) 500 MG tablet Take 1 tablet (500 mg total) by mouth 2 (two) times daily. 08/03/24  Yes Nickolis Diel, DO  naproxen  (NAPROSYN ) 500 MG tablet Take 1  tablet (500 mg total) by mouth 2 (two) times daily with a meal. 08/03/24  Yes Amada Hallisey, DO  ferrous sulfate  325 (65 FE) MG tablet Take 1 tablet (325 mg total) by mouth daily. 11/23/22 11/23/23  Ward, Josette SAILOR, DO  hydrOXYzine  (ATARAX ) 25 MG tablet Take 25 mg by mouth 3 (three) times daily as needed for anxiety.    [provider]  ondansetron  (ZOFRAN -ODT) 8 MG disintegrating tablet Take 1 tablet (8 mg total) by mouth every 8 (eight) hours as needed for nausea or vomiting. 07/21/23   Leonce Garnette BIRCH, MD    Family History Family History  Problem Relation Age of Onset   Diabetes Mother    Healthy Father     Social History Social History   Tobacco Use   Smoking status: Never   Smokeless tobacco: Never  Vaping Use   Vaping status: Never Used  Substance Use Topics   Alcohol use: Yes   Drug use: Not Currently     Allergies   Ferrous gluconate, Penicillins, Ceclor [cefaclor], and Latex   Review of Systems Review of Systems: :negative  unless otherwise stated in HPI.      Physical Exam Triage Vital Signs ED Triage Vitals  Encounter Vitals Group     BP      Girls Systolic BP Percentile      Girls Diastolic BP Percentile      Boys Systolic BP Percentile      Boys Diastolic BP Percentile      Pulse      Resp      Temp      Temp src      SpO2      Weight      Height      Head Circumference      Peak Flow      Pain Score      Pain Loc      Pain Education      Exclude from Growth Chart    No data found.  Updated Vital Signs BP 113/79 (BP Location: Left Arm)   Pulse 67   Temp 98.4 F (36.9 C) (Oral)   Resp 16   Ht 5' 6 (1.676 m)   Wt 60.7 kg   LMP 07/25/2024 (Exact Date)   SpO2 98%   BMI 21.61 kg/m   Visual Acuity Right Eye Distance:   Left Eye Distance:   Bilateral Distance:    Right Eye Near:   Left Eye Near:    Bilateral Near:     Physical Exam GEN: well appearing female in no acute distress  CVS: well perfused  RESP: speaking  in full sentences without pause, no respiratory distress  MSK:  LE: TTP and hypertonicity with woody edema of bilateral thighs, full extension but limited flexion due to pain.  No ecchymosis appreciated  Sensation intact. Peripheral pulses intact.   UC Treatments / Results  Labs (all labs ordered are listed, but only abnormal results are displayed) Labs Reviewed  COMPREHENSIVE METABOLIC PANEL WITH GFR - Abnormal; Notable for the following components:      Result Value   AST 138 (*)    All other components within normal limits  CBC WITH DIFFERENTIAL/PLATELET - Abnormal; Notable for the following components:   RBC 5.18 (*)    Hemoglobin 11.4 (*)    MCV 71.2 (*)    MCH 22.0 (*)    RDW 17.7 (*)    All other components within normal limits  CK - Abnormal; Notable for the following components:   Total CK 6,296 (*)    All other components within normal limits    EKG   Radiology No results found.   Procedures Procedures (including critical care time)  Medications Ordered in UC Medications - No data to display  Initial Impression / Assessment and Plan / UC Course  I have reviewed the triage vital signs and the nursing notes.  Pertinent labs & imaging results that were available during my care of the patient were reviewed by me and considered in my medical decision making (see chart for details).  Clinical Course as of 08/03/24 1615  Wed Aug 03, 2024  1008 Updated patient as we are waiting for labs to be return as the lab has been busy.  [VB]  1559 CK ~6200.  Advised to go to the ED for further evaluation.  [VB]    Clinical Course User Index [VB] Jadis Pitter, DO     Pt is a 28 y.o.  female with 3 days of bilateral thigh pain after walking 2 miles this weekend.   On exam, pt  has tenderness diffusely across her thighs concerning for muscle strain but unable to rule out rhabdomyolysis.   Thigh imaging deferred.  Doubt DVT at this time as symptoms are bilateral. She has  no history of PE or DVT.  Obtained CBC, BMP and CK.  CBC showing no leukocytosis or leukopenia.  She has some much improved microcytic anemia.  She has history of iron deficiency anemia.  CMP showing elevated and AST.  She reports recent alcohol use.  Serum creatinine is normal.  CK is pending.  Stressed the importance of hydration.  Patient to gradually return to normal activities, as tolerated and continue ordinary activities within the limits permitted by pain. Prescribed Naproxen  sodium  and muscle relaxer  for pain relief.  Tylenol  PRN. Advised patient to avoid OTC NSAIDs while taking prescription NSAID.   Return and ED precautions given. Understanding voiced. Discussed MDM, treatment plan and plan for follow-up with patient who agrees with plan.    CK returned is elevated around 6300.  Patient called and updated with results.  Advise ED evaluation and she is agreeable.  She will travel to [facility] ED.  Called and spoke with triage RN regarding patient's arrival and elevated CK level.  Final Clinical Impressions(s) / UC Diagnoses   Final diagnoses:  Bilateral thigh pain  Microcytic anemia  Non-traumatic rhabdomyolysis     Discharge Instructions      It is important that you drink a adequate amount of fluids.  Avoid strenuous physical activity for now.  Take the muscle relaxer at bedtime and then also as needed throughout the day.  Take 1000 mg of Tylenol  with Naprosyn  twice a day.   You have anemia but it is improving. Your hemoglobin was 11.4 (nearly normal).   Continue taking your iron supplements.  Your kidney function and electrolytes are normal.  One of your liver enzymes, called AST is mildly elevated.  We will monitor this for now.  It may be a good idea to have this rechecked in the next 2 weeks with your PCP.   Your CK (creatine kinase) level will return later today.  If significantly abnormal, I will call you. You can see your results in MyChart.      ED Prescriptions      Medication Sig Dispense Auth. Provider   methocarbamol  (ROBAXIN ) 500 MG tablet Take 1 tablet (500 mg total) by mouth 2 (two) times daily. 20 tablet Stormy Connon, DO   naproxen  (NAPROSYN ) 500 MG tablet Take 1 tablet (500 mg total) by mouth 2 (two) times daily with a meal. 30 tablet Keriann Rankin, DO      PDMP not reviewed this encounter.   Jamarii Banks, DO 08/03/24 1615

## 2024-08-04 DIAGNOSIS — Z862 Personal history of diseases of the blood and blood-forming organs and certain disorders involving the immune mechanism: Secondary | ICD-10-CM | POA: Diagnosis not present

## 2024-08-04 DIAGNOSIS — M6282 Rhabdomyolysis: Secondary | ICD-10-CM | POA: Diagnosis not present

## 2024-08-04 LAB — CBC
HCT: 31.1 % — ABNORMAL LOW (ref 36.0–46.0)
Hemoglobin: 9.5 g/dL — ABNORMAL LOW (ref 12.0–15.0)
MCH: 22.1 pg — ABNORMAL LOW (ref 26.0–34.0)
MCHC: 30.5 g/dL (ref 30.0–36.0)
MCV: 72.3 fL — ABNORMAL LOW (ref 80.0–100.0)
Platelets: 189 K/uL (ref 150–400)
RBC: 4.3 MIL/uL (ref 3.87–5.11)
RDW: 17.1 % — ABNORMAL HIGH (ref 11.5–15.5)
WBC: 3.3 K/uL — ABNORMAL LOW (ref 4.0–10.5)
nRBC: 0 % (ref 0.0–0.2)

## 2024-08-04 LAB — BASIC METABOLIC PANEL WITH GFR
Anion gap: 2 — ABNORMAL LOW (ref 5–15)
BUN: 9 mg/dL (ref 6–20)
CO2: 23 mmol/L (ref 22–32)
Calcium: 8.1 mg/dL — ABNORMAL LOW (ref 8.9–10.3)
Chloride: 114 mmol/L — ABNORMAL HIGH (ref 98–111)
Creatinine, Ser: 0.76 mg/dL (ref 0.44–1.00)
GFR, Estimated: >60 mL/min (ref >60)
Glucose, Bld: 87 mg/dL (ref 70–99)
Potassium: 3.8 mmol/L (ref 3.5–5.1)
Sodium: 139 mmol/L (ref 135–145)

## 2024-08-04 LAB — HIV ANTIBODY (ROUTINE TESTING W REFLEX): HIV Screen 4th Generation wRfx: NONREACTIVE

## 2024-08-04 LAB — CK: Total CK: 5391 U/L — ABNORMAL HIGH (ref 38–234)

## 2024-08-04 MED ORDER — SODIUM CHLORIDE 0.9 % IV SOLN: INTRAVENOUS | Status: AC

## 2024-08-04 NOTE — ED Provider Notes (Signed)
 [facility] Provider Note    Event Date/Time   First MD Initiated Contact with Patient 08/03/24 1817     (approximate)   History   Abnormal Lab   HPI  Dana Walker is a 28 y.o. female with a past medical history of iron deficient anemia, general anxiety disorder who presents to the emergency department with 4 days of bilateral cramping in her thighs and concern for rhabdomyolysis from the urgent care center.  This weekend patient walked 2 miles and subsequently took an at high intensity consult which she states was very exercise/exertion.  For the past several days she has had cramps in her upper legs bilaterally.  She reports that she is still making urine although it has been dark.  She was seen at an urgent care facility earlier today and CK did return elevated and was told to come to our facility      Physical Exam   Triage Vital Signs: ED Triage Vitals  Encounter Vitals Group     BP 08/03/24 1802 (!) 113/95     Girls Systolic BP Percentile --      Girls Diastolic BP Percentile --      Boys Systolic BP Percentile --      Boys Diastolic BP Percentile --      Pulse Rate 08/03/24 1802 80     Resp 08/03/24 1802 18     Temp 08/03/24 1802 98.3 F (36.8 C)     Temp Source 08/03/24 1802 Oral     SpO2 08/03/24 1802 100 %     Weight 08/03/24 1800 132 lb 4.4 oz (60 kg)     Height 08/03/24 1800 5' 6 (1.676 m)     Head Circumference --      Peak Flow --      Pain Score 08/03/24 1800 7     Pain Loc --      Pain Education --      Exclude from Growth Chart --     Most recent vital signs: Vitals:   08/03/24 2138 08/03/24 2153  BP: 100/65 110/70  Pulse: 60 64  Resp: 18 18  Temp: 97.6 F (36.4 C) 98.9 F (37.2 C)  SpO2: 100% 100%    Nursing Triage Note reviewed. Vital signs reviewed and patients oxygen saturation is normoxic  General: Patient is well nourished, well developed, awake and alert, Head: Normocephalic and atraumatic Eyes: Normal  inspection, extraocular muscles intact, no conjunctival pallor Ear, nose, throat: Normal external exam Neck: Normal range of motion Respiratory: Patient is in no respiratory distress, lungs CTAB Cardiovascular: Patient is not tachycardic, RRR without murmur appreciated GI: Abd SNT with no guarding or rebound  Back: Normal inspection of the back with good strength and range of motion throughout all ext Extremities: pulses intact with good cap refills, no LE pitting edema or calf tenderness Extremities with all compartments soft with particular attention paid to the bilateral proximal lower extremities Neuro: The patient is alert and oriented to person, place, and time, appropriately conversive, with 5/5 bilat UE/LE strength, no gross motor or sensory defects noted. Coordination appears to be adequate. Skin: Warm, dry, and intact Psych: normal mood and affect, no SI or HI  ED Results / Procedures / Treatments   Labs (all labs ordered are listed, but only abnormal results are displayed) Labs Reviewed  CBC WITH DIFFERENTIAL/PLATELET - Abnormal; Notable for the following components:      Result Value   Hemoglobin 11.0 (*)  MCV 72.9 (*)    MCH 22.0 (*)    RDW 16.9 (*)    All other components within normal limits  COMPREHENSIVE METABOLIC PANEL WITH GFR - Abnormal; Notable for the following components:   Glucose, Bld 101 (*)    AST 156 (*)    ALT 47 (*)    All other components within normal limits  CK - Abnormal; Notable for the following components:   Total CK 7,644 (*)    All other components within normal limits  URINALYSIS, ROUTINE W REFLEX MICROSCOPIC - Abnormal; Notable for the following components:   Color, Urine YELLOW (*)    APPearance HAZY (*)    Leukocytes,Ua MODERATE (*)    Bacteria, UA RARE (*)    All other components within normal limits  HCG, QUANTITATIVE, PREGNANCY  BASIC METABOLIC PANEL WITH GFR  CBC  CK  HIV ANTIBODY (ROUTINE TESTING W REFLEX)     EKG EKG  and rhythm strip are interpreted by myself:   EKG: [Normal sinus rhythm] at heart rate of 69, normal QRS duration, Qtc 415 , normal ST segments and T waves no ectopy EKG not consistent with Acute STEMI Rhythm strip: NSR in lead II   RADIOLOGY  None  PROCEDURES:  Critical Care performed: No  Procedures   MEDICATIONS ORDERED IN ED: Medications  hydrOXYzine  (ATARAX ) tablet 25 mg (25 mg Oral Given 08/03/24 2227)  ferrous sulfate  tablet 325 mg (has no administration in time range)  methocarbamol  (ROBAXIN ) tablet 500 mg (500 mg Oral Given 08/03/24 2228)  enoxaparin  (LOVENOX ) injection 40 mg (has no administration in time range)  0.9 %  sodium chloride  infusion ( Intravenous New Bag/Given 08/03/24 2225)  acetaminophen  (TYLENOL ) tablet 650 mg (has no administration in time range)    Or  acetaminophen  (TYLENOL ) suppository 650 mg (has no administration in time range)  traZODone  (DESYREL ) tablet 25 mg (has no administration in time range)  magnesium  hydroxide (MILK OF MAGNESIA) suspension 30 mL (has no administration in time range)  ondansetron  (ZOFRAN ) tablet 4 mg (has no administration in time range)    Or  ondansetron  (ZOFRAN ) injection 4 mg (has no administration in time range)  ketorolac  (TORADOL ) 15 MG/ML injection 15 mg (has no administration in time range)  morphine  (PF) 2 MG/ML injection 2 mg (2 mg Intravenous Given 08/03/24 2232)  sodium chloride  0.9 % bolus 1,000 mL (0 mLs Intravenous Stopped 08/03/24 2120)  sodium chloride  0.9 % bolus 1,000 mL (0 mLs Intravenous Stopped 08/03/24 2120)     IMPRESSION / MDM / ASSESSMENT AND PLAN / ED COURSE                                Differential diagnosis includes, but is not limited to, rhabdomyolysis, arrhythmia, electrolyte derangement, UTI, pregnancy  ED course: Patient well-appearing but CK is very elevated.  She was given 2 L of IV fluids.  At this time I have no concern for compartment syndrome given exam however this will need to  be monitored especially with ongoing fluid hydration.  She was able to given a urine sample and this did not have a copious amount of protein.  Given that I do not expect her CK to improve drastically overnight Case discussed with hospitalist for admission   Clinical Course as of 08/04/24 0018  Wed Aug 03, 2024  2009 Calling lab to determine delay in CK.  There was an issue with the machine  and is currently running [HD]  2015/08/19 CK Total(!): 7,644 Elevated [HD]  08-19-15 Fluids are running [HD]  2016/08/18 We discussed possible admission with hospitalist [HD]    Clinical Course User Index [HD] Nicholaus Rolland BRAVO, MD   Data(2/3 categories following were performed): I reviewed or ordered at least three unique tests, external notes, and/or the history required an independent historian as one of the three requirements as following: At least 3 labs/imaging studies were obtained and/or reviewed. AND  I discussed the management of the patient with the following external physician or qualified healthcare provider: Admitting physician  Risk: This patient has a high risk of morbidity due to further diagnostic testing or treatment. Rationale: Decision made regarding admission  Admit Level 5 - Labs/Rads, Admit, Consult:  Suggested E/M Coding Level: 5, 99285  This level has been selected based on the 2022-08-18 CPT guidelines for E/M codes in the Emergency Department based on 2/3 of the CoPA, Data, and Risk.   FINAL CLINICAL IMPRESSION(S) / ED DIAGNOSES   Final diagnoses:  Non-traumatic rhabdomyolysis     Rx / DC Orders   ED Discharge Orders     None        Note:  This document was prepared using Dragon voice recognition software and may include unintentional dictation errors.   Nicholaus Rolland BRAVO, MD 08/04/24 [phone]

## 2024-08-04 NOTE — Hospital Course (Addendum)
 Partly taken from H&P.  Dana Walker is a 28 y.o.  African-American female with medical history significant for anxiety, anemia, and migraine, who presented to the emergency room with acute onset of bilateral thigh pain.  She walked 2 miles today which is more than her usual.   On presentation stable vitals, labs with AST 156, ALT 47, CK 7644, UA with few WBCs and rare bacteria.  Patient was given IV fluid.  Admitted for concern of rhabdomyolysis.  8/28: Vital stable, CK with some improvement to 5391, hemoglobin with some decreased to 9.5 from 11-also line decreased so likely some dilutional effect.  Continuing IV fluid.  8/29: Remained hemodynamically stable, CK slowly improving better still above 4000.  Slowly improving transaminitis.  Patient wants to go home, received another liter of IV fluid and instructed to keep herself well-hydrated.  She was also instructed to follow-up with her primary care provider in 2 to 3 days and have her CMP and CK levels rechecked.  Patient seems understanding.  She will continue with her home medications and follow-up with her providers for further assistance.

## 2024-08-04 NOTE — Progress Notes (Signed)
Per Dr Reesa Chew, dc tele monitoring

## 2024-08-04 NOTE — Progress Notes (Signed)
  Progress Note   Patient: Dana Walker FMW:[id number] DOB: 1996-06-13 DOA: 08/03/2024     1 DOS: the patient was seen and examined on 08/04/2024   Brief hospital course: Partly taken from H&P.  Donelle S Spagnuolo is a 28 y.o.  African-American female with medical history significant for anxiety, anemia, and migraine, who presented to the emergency room with acute onset of bilateral thigh pain.  She walked 2 miles today which is more than her usual.   On presentation stable vitals, labs with AST 156, ALT 47, CK 7644, UA with few WBCs and rare bacteria.  Patient was given IV fluid.  Admitted for concern of rhabdomyolysis.  8/28: Vital stable, CK with some improvement to 5391, hemoglobin with some decreased to 9.5 from 11-also line decreased so likely some dilutional effect.  Continuing IV fluid.  Assessment and Plan: * Rhabdomyolysis CK with some improvement to 5391. Continue to have bilateral leg pain. - Continue with IV fluid -Monitor CK  History of anemia - Continue ferrous sulfate .   Subjective: Patient was seen and examined today.  Continues to have bilateral leg pain.  No other concern.  Physical Exam: Vitals:   08/03/24 2138 08/03/24 2153 08/04/24 0500 08/04/24 0908  BP: 100/65 110/70 111/69 111/80  Pulse: 60 64 65 72  Resp: 18 18 16 15   Temp: 97.6 F (36.4 C) 98.9 F (37.2 C) 98.2 F (36.8 C) 98.3 F (36.8 C)  TempSrc:  Oral Oral   SpO2: 100% 100% 100% 100%  Weight:      Height:       General.  Well-developed lady, in no acute distress. Pulmonary.  Lungs clear bilaterally, normal respiratory effort. CV.  Regular rate and rhythm, no JVD, rub or murmur. Abdomen.  Soft, nontender, nondistended, BS positive. CNS.  Alert and oriented .  No focal neurologic deficit. Extremities.  No edema, no cyanosis, pulses intact and symmetrical. Psychiatry.  Judgment and insight appears normal.   Data Reviewed: Prior data reviewed  Family Communication: Discussed with  patient  Disposition: Status is: Inpatient Remains inpatient appropriate because: Severity of illness  Planned Discharge Destination: Home  DVT prophylaxis.  Lovenox  Time spent: 40 minutes  This record has been created using Conservation officer, historic buildings. Errors have been sought and corrected,but may not always be located. Such creation errors do not reflect on the standard of care.   Author: Amaryllis Dare, MD 08/04/2024 1:14 PM  For on call review [email or web address].

## 2024-08-05 DIAGNOSIS — Z862 Personal history of diseases of the blood and blood-forming organs and certain disorders involving the immune mechanism: Secondary | ICD-10-CM | POA: Diagnosis not present

## 2024-08-05 DIAGNOSIS — M6282 Rhabdomyolysis: Secondary | ICD-10-CM | POA: Diagnosis not present

## 2024-08-05 LAB — COMPREHENSIVE METABOLIC PANEL WITH GFR
ALT: 49 U/L — ABNORMAL HIGH (ref 0–44)
AST: 119 U/L — ABNORMAL HIGH (ref 15–41)
Albumin: 2.8 g/dL — ABNORMAL LOW (ref 3.5–5.0)
Alkaline Phosphatase: 39 U/L (ref 38–126)
Anion gap: 5 (ref 5–15)
BUN: 11 mg/dL (ref 6–20)
CO2: 23 mmol/L (ref 22–32)
Calcium: 8.3 mg/dL — ABNORMAL LOW (ref 8.9–10.3)
Chloride: 111 mmol/L (ref 98–111)
Creatinine, Ser: 0.78 mg/dL (ref 0.44–1.00)
GFR, Estimated: >60 mL/min (ref >60)
Glucose, Bld: 96 mg/dL (ref 70–99)
Potassium: 4 mmol/L (ref 3.5–5.1)
Sodium: 139 mmol/L (ref 135–145)
Total Bilirubin: 0.3 mg/dL (ref 0.0–1.2)
Total Protein: 5.2 g/dL — ABNORMAL LOW (ref 6.5–8.1)

## 2024-08-05 LAB — CK: Total CK: 4566 U/L — ABNORMAL HIGH (ref 38–234)

## 2024-08-05 MED ORDER — LACTATED RINGERS IV SOLN: INTRAVENOUS | Status: DC

## 2024-08-05 MED ORDER — NAPROXEN 500 MG PO TABS: 500.0000 mg | ORAL_TABLET | Freq: Two times a day (BID) | ORAL | 0 refills | Status: AC | PRN

## 2024-08-05 NOTE — Discharge Summary (Signed)
 Physician Discharge Summary   Patient: Dana Walker MRN: [id number] DOB: 1996-08-26  Admit date:     08/03/2024  Discharge date: 08/05/24  Discharge Physician: Amaryllis Dare   PCP: Quin Damien Maier, PA   Recommendations at discharge:  Please obtain CMP and CK levels within next 2 to 3 days to ensure that it is keep improving. Follow-up with primary care provider within next few days  Discharge Diagnoses: Principal Problem:   Rhabdomyolysis Active Problems:   History of anemia   Hospital Course: Partly taken from H&P.  Dana Walker is a 28 y.o.  African-American female with medical history significant for anxiety, anemia, and migraine, who presented to the emergency room with acute onset of bilateral thigh pain.  She walked 2 miles today which is more than her usual.   On presentation stable vitals, labs with AST 156, ALT 47, CK 7644, UA with few WBCs and rare bacteria.  Patient was given IV fluid.  Admitted for concern of rhabdomyolysis.  8/28: Vital stable, CK with some improvement to 5391, hemoglobin with some decreased to 9.5 from 11-also line decreased so likely some dilutional effect.  Continuing IV fluid.  8/29: Remained hemodynamically stable, CK slowly improving better still above 4000.  Slowly improving transaminitis.  Patient wants to go home, received another liter of IV fluid and instructed to keep herself well-hydrated.  She was also instructed to follow-up with her primary care provider in 2 to 3 days and have her CMP and CK levels rechecked.  Patient seems understanding.  She will continue with her home medications and follow-up with her providers for further assistance.  Assessment and Plan: * Rhabdomyolysis CK with some improvement to 4608>5433.SABRA Patient is being discharged at her request, did receive 1 more liter of IV fluid and instructions to keep herself well-hydrated She will need repeat levels by PCP next few days  History of anemia - Continue  ferrous sulfate .   Consultants: None Procedures performed: None Disposition: Home Diet recommendation:  Discharge Diet Orders (From admission, onward)     Start     Ordered   08/05/24 0000  Diet - low sodium heart healthy        08/05/24 1229           Regular diet DISCHARGE MEDICATION: Allergies as of 08/05/2024       Reactions   Ferrous Gluconate Anaphylaxis   Developed chest tightness, dyspnea, urticaria. Given dose of IM epi.   Penicillins Diarrhea, Hives   Ceclor [cefaclor] Nausea Only   Kiwi Extract Rash   Latex Rash        Medication List     TAKE these medications    ferrous sulfate  325 (65 FE) MG tablet Take 1 tablet (325 mg total) by mouth daily.   hydrOXYzine  25 MG tablet Commonly known as: ATARAX  Take 25 mg by mouth 3 (three) times daily as needed for anxiety.   methocarbamol  500 MG tablet Commonly known as: ROBAXIN  Take 1 tablet (500 mg total) by mouth 2 (two) times daily.   naproxen  500 MG tablet Commonly known as: NAPROSYN  Take 1 tablet (500 mg total) by mouth 2 (two) times daily as needed for moderate pain (pain score 4-6). What changed:  when to take this reasons to take this   ondansetron  8 MG disintegrating tablet Commonly known as: ZOFRAN -ODT Take 1 tablet (8 mg total) by mouth every 8 (eight) hours as needed for nausea or vomiting.        Follow-up  Information     [facility] [place], [place]. Schedule an appointment as soon as possible for a visit in 3 day(s).   Specialty: Family Medicine Why: hospital follow up, need repeat CMP and CK levels by Tuesday or Wednesday Contact information: [address] [place] [address] [phone]                Discharge Exam: Fredricka Weights   08/03/24 1800  Weight: 60 kg   General.  Well developed lady, In no acute distress. Pulmonary.  Lungs clear bilaterally, normal respiratory effort. CV.  Regular rate and rhythm, no JVD, rub or murmur. Abdomen.  Soft,  nontender, nondistended, BS positive. CNS.  Alert and oriented .  No focal neurologic deficit. Extremities.  No edema, no cyanosis, pulses intact and symmetrical. Psychiatry.  Judgment and insight appears normal.   Condition at discharge: stable  The results of significant diagnostics from this hospitalization (including imaging, microbiology, ancillary and laboratory) are listed below for reference.   Imaging Studies: No results found.  Microbiology: Results for orders placed or performed during the hospital encounter of 11/27/19  Rapid Strep Screen ([facility] ONLY)     Status: None   Collection Time: 11/27/19 10:46 AM   Specimen: Other  Result Value Ref Range Status   Streptococcus, Group A Screen (Direct) NEGATIVE NEGATIVE Final    Comment: (NOTE) A Rapid Antigen test may result negative if the antigen level in the sample is below the detection level of this test. The FDA has not cleared this test as a stand-alone test therefore the rapid antigen negative result has reflexed to a Group A Strep culture. Performed at [facility], [address] [place], [place] [address]   Culture, group A strep     Status: None   Collection Time: 11/27/19 10:46 AM   Specimen: Throat  Result Value Ref Range Status   Specimen Description   Final    THROAT Performed at [facility], [address] [place], [place] [address]    Special Requests   Final    NONE Reflexed from [id number] Performed at [facility], [address], [place], [place] [address]    Culture   Final    NO GROUP A STREP (S.PYOGENES) ISOLATED Performed at [facility], [address], [place], [place] [address]    Report Status 11/30/2019 FINAL  Final  Novel Coronavirus, NAA (Hosp order, Send-out to Ref Lab; TAT 18-24 hrs     Status: None   Collection Time: 11/27/19 11:16 AM   Specimen: Nasopharyngeal Swab; Respiratory  Result Value Ref Range Status   SARS-CoV-2, NAA  NOT DETECTED NOT DETECTED Final    Comment: (NOTE) This nucleic acid amplification test was developed and its performance characteristics determined by [employer]. Nucleic acid amplification tests include PCR and TMA. This test has not been FDA cleared or approved. This test has been authorized by FDA under an Emergency Use Authorization (EUA). This test is only authorized for the duration of time the declaration that circumstances exist justifying the authorization of the emergency use of in vitro diagnostic tests for detection of SARS-CoV-2 virus and/or diagnosis of COVID-19 infection under section 564(b)(1) of the Act, 21 U.S.C. 639aaa-6(a) (1), unless the authorization is terminated or revoked sooner. When diagnostic testing is negative, the possibility of a false negative result should be considered in the context of a patient's recent exposures and the presence of clinical signs  and symptoms consistent with COVID-19. An individual without symptoms of COVID- 19 and who is not shedding SARS-CoV-2 vi rus would expect to have a negative (not detected) result in this assay. Performed At: [facility] [address] [place], [place] [id number] Loran Gales MDPhD Ey:[phone]    Coronavirus Source NASOPHARYNGEAL  Final    Comment: Performed at [facility], [address] [place], [place] [address]    Labs: CBC: Recent Labs  Lab 08/03/24 0915 08/03/24 1845 08/04/24 0522  WBC 4.0 4.3 3.3*  NEUTROABS 2.2 2.3  --   HGB 11.4* 11.0* 9.5*  HCT 36.9 36.4 31.1*  MCV 71.2* 72.9* 72.3*  PLT 220 233 189   Basic Metabolic Panel: Recent Labs  Lab 08/03/24 0915 08/03/24 1845 08/04/24 0522 08/05/24 0423  NA 136 140 139 139  K 3.8 3.6 3.8 4.0  CL 103 109 114* 111  CO2 24 25 23 23   GLUCOSE 87 101* 87 96  BUN 9 11 9 11   CREATININE 0.79 0.74 0.76 0.78  CALCIUM 9.2 9.4 8.1* 8.3*   Liver Function Tests: Recent Labs  Lab 08/03/24 0915 08/03/24 1845  08/05/24 0423  AST 138* 156* 119*  ALT 39 47* 49*  ALKPHOS 54 49 39  BILITOT 0.4 0.5 0.3  PROT 7.1 6.8 5.2*  ALBUMIN 3.8 3.8 2.8*   CBG: No results for input(s): GLUCAP in the last 168 hours.  Discharge time spent: greater than 30 minutes.  This record has been created using Conservation officer, historic buildings. Errors have been sought and corrected,but may not always be located. Such creation errors do not reflect on the standard of care.   Signed: Amaryllis Dare, MD [facility] 08/05/2024

## 2024-08-05 NOTE — Plan of Care (Signed)
